# Patient Record
Sex: Female | Born: 1955 | Race: Black or African American | Hispanic: No | Marital: Single | Smoking: Never smoker
Health system: Southern US, Community
[De-identification: ages and names within clinical notes are randomized; demographics above are authoritative.]

## PROBLEM LIST (undated history)

## (undated) DIAGNOSIS — M1990 Unspecified osteoarthritis, unspecified site: Secondary | ICD-10-CM

## (undated) DIAGNOSIS — M5010 Cervical disc disorder with radiculopathy, unspecified cervical region: Secondary | ICD-10-CM

## (undated) DIAGNOSIS — J45909 Unspecified asthma, uncomplicated: Secondary | ICD-10-CM

## (undated) DIAGNOSIS — K219 Gastro-esophageal reflux disease without esophagitis: Secondary | ICD-10-CM

## (undated) DIAGNOSIS — T4145XA Adverse effect of unspecified anesthetic, initial encounter: Secondary | ICD-10-CM

## (undated) DIAGNOSIS — Z8719 Personal history of other diseases of the digestive system: Secondary | ICD-10-CM

## (undated) DIAGNOSIS — M549 Dorsalgia, unspecified: Secondary | ICD-10-CM

## (undated) DIAGNOSIS — T8859XA Other complications of anesthesia, initial encounter: Secondary | ICD-10-CM

## (undated) DIAGNOSIS — G4700 Insomnia, unspecified: Secondary | ICD-10-CM

## (undated) DIAGNOSIS — J189 Pneumonia, unspecified organism: Secondary | ICD-10-CM

## (undated) HISTORY — PX: COLONOSCOPY: SHX174

## (undated) HISTORY — PX: APPENDECTOMY: SHX54

## (undated) HISTORY — PX: CERVICAL SPINE SURGERY: SHX589

## (undated) HISTORY — PX: ABDOMINAL HYSTERECTOMY: SHX81

## (undated) HISTORY — PX: DILATION AND CURETTAGE OF UTERUS: SHX78

## (undated) HISTORY — DX: Cervical disc disorder with radiculopathy, unspecified cervical region: M50.10

## (undated) HISTORY — DX: Gastro-esophageal reflux disease without esophagitis: K21.9

## (undated) HISTORY — PX: BACK SURGERY: SHX140

## (undated) HISTORY — PX: TONSILLECTOMY: SUR1361

---

## 1998-07-25 ENCOUNTER — Ambulatory Visit (HOSPITAL_COMMUNITY): Admission: RE | Admit: 1998-07-25 | Discharge: 1998-07-25 | Payer: Self-pay | Admitting: Gynecology

## 1998-12-23 ENCOUNTER — Other Ambulatory Visit: Admission: RE | Admit: 1998-12-23 | Discharge: 1998-12-23 | Payer: Self-pay | Admitting: Gynecology

## 1999-03-01 ENCOUNTER — Emergency Department (HOSPITAL_COMMUNITY): Admission: EM | Admit: 1999-03-01 | Discharge: 1999-03-01 | Payer: Self-pay | Admitting: Emergency Medicine

## 1999-03-31 ENCOUNTER — Ambulatory Visit (HOSPITAL_COMMUNITY): Admission: RE | Admit: 1999-03-31 | Discharge: 1999-03-31 | Payer: Self-pay | Admitting: Gastroenterology

## 1999-03-31 ENCOUNTER — Encounter: Payer: Self-pay | Admitting: Gastroenterology

## 1999-04-24 ENCOUNTER — Ambulatory Visit (HOSPITAL_COMMUNITY): Admission: RE | Admit: 1999-04-24 | Discharge: 1999-04-24 | Payer: Self-pay | Admitting: Gastroenterology

## 2000-03-24 ENCOUNTER — Encounter: Payer: Self-pay | Admitting: Family Medicine

## 2000-03-24 ENCOUNTER — Encounter: Admission: RE | Admit: 2000-03-24 | Discharge: 2000-03-24 | Payer: Self-pay

## 2000-04-06 ENCOUNTER — Encounter: Admission: RE | Admit: 2000-04-06 | Discharge: 2000-04-06 | Payer: Self-pay

## 2001-06-02 ENCOUNTER — Encounter: Admission: RE | Admit: 2001-06-02 | Discharge: 2001-06-02 | Payer: Self-pay | Admitting: Obstetrics and Gynecology

## 2001-06-02 ENCOUNTER — Encounter: Payer: Self-pay | Admitting: Obstetrics and Gynecology

## 2001-12-21 ENCOUNTER — Encounter: Payer: Self-pay | Admitting: Chiropractor

## 2001-12-21 ENCOUNTER — Ambulatory Visit (HOSPITAL_COMMUNITY): Admission: RE | Admit: 2001-12-21 | Discharge: 2001-12-21 | Payer: Self-pay | Admitting: Chiropractor

## 2002-02-13 ENCOUNTER — Other Ambulatory Visit: Admission: RE | Admit: 2002-02-13 | Discharge: 2002-02-13 | Payer: Self-pay | Admitting: Obstetrics and Gynecology

## 2002-04-10 ENCOUNTER — Encounter: Payer: Self-pay | Admitting: Specialist

## 2002-04-17 ENCOUNTER — Inpatient Hospital Stay (HOSPITAL_COMMUNITY): Admission: RE | Admit: 2002-04-17 | Discharge: 2002-04-19 | Payer: Self-pay | Admitting: Specialist

## 2002-04-17 ENCOUNTER — Encounter: Payer: Self-pay | Admitting: Specialist

## 2002-05-08 ENCOUNTER — Observation Stay (HOSPITAL_COMMUNITY): Admission: EM | Admit: 2002-05-08 | Discharge: 2002-05-09 | Payer: Self-pay | Admitting: Emergency Medicine

## 2002-05-08 ENCOUNTER — Encounter: Payer: Self-pay | Admitting: *Deleted

## 2002-06-20 ENCOUNTER — Encounter: Payer: Self-pay | Admitting: Obstetrics and Gynecology

## 2002-06-20 ENCOUNTER — Encounter: Admission: RE | Admit: 2002-06-20 | Discharge: 2002-06-20 | Payer: Self-pay | Admitting: Obstetrics and Gynecology

## 2014-12-09 ENCOUNTER — Emergency Department (HOSPITAL_COMMUNITY)
Admission: EM | Admit: 2014-12-09 | Discharge: 2014-12-09 | Disposition: A | Discharge status: Home / Self Care | Payer: No Typology Code available for payment source | Attending: Emergency Medicine | Admitting: Emergency Medicine

## 2014-12-09 ENCOUNTER — Emergency Department (HOSPITAL_COMMUNITY): Payer: No Typology Code available for payment source

## 2014-12-09 ENCOUNTER — Encounter (HOSPITAL_COMMUNITY): Payer: Self-pay | Admitting: Emergency Medicine

## 2014-12-09 DIAGNOSIS — M5412 Radiculopathy, cervical region: Secondary | ICD-10-CM | POA: Diagnosis not present

## 2014-12-09 DIAGNOSIS — M25511 Pain in right shoulder: Secondary | ICD-10-CM | POA: Diagnosis present

## 2014-12-09 DIAGNOSIS — Z981 Arthrodesis status: Secondary | ICD-10-CM | POA: Diagnosis not present

## 2014-12-09 DIAGNOSIS — M542 Cervicalgia: Secondary | ICD-10-CM

## 2014-12-09 HISTORY — DX: Dorsalgia, unspecified: M54.9

## 2014-12-09 MED ORDER — DIAZEPAM 5 MG PO TABS: 5.0000 mg | ORAL_TABLET | Freq: Two times a day (BID) | ORAL | Status: DC

## 2014-12-09 MED ORDER — MORPHINE SULFATE 4 MG/ML IJ SOLN
4.0000 mg | Freq: Once | INTRAMUSCULAR | Status: AC
  Administered 2014-12-09: 4 mg via INTRAMUSCULAR
  Filled 2014-12-09: qty 1

## 2014-12-09 MED ORDER — IBUPROFEN 200 MG PO TABS
400.0000 mg | ORAL_TABLET | Freq: Once | ORAL | Status: AC
  Administered 2014-12-09: 400 mg via ORAL
  Filled 2014-12-09: qty 2

## 2014-12-09 MED ORDER — OXYCODONE-ACETAMINOPHEN 5-325 MG PO TABS: ORAL_TABLET | ORAL | Status: DC

## 2014-12-09 MED ORDER — DIAZEPAM 5 MG PO TABS
5.0000 mg | ORAL_TABLET | Freq: Once | ORAL | Status: AC
  Administered 2014-12-09: 5 mg via ORAL
  Filled 2014-12-09: qty 1

## 2014-12-09 NOTE — ED Notes (Signed)
Pt reports increased pain in r/shoulder over last week, radiating to back. Tx with dry heating pad. Denies trauma , denies change in sleeping arrangements

## 2014-12-09 NOTE — ED Provider Notes (Signed)
CSN: [id number]     Arrival date & time 12/09/14  1110 History   First MD Initiated Contact with Patient 12/09/14 1203     Chief Complaint  Patient presents with  . Shoulder Pain    one week hx of increased shoulder pain, denies trauma     (Consider location/radiation/quality/duration/timing/severity/associated sxs/prior Treatment) HPI  Tabitha Mcdonald is a 58 y.o. female complaining of severe pain to neck and right shoulder worsening over the course of 3 days. Patient has been applying heat with little relief. Patient rates her pain at 9 out of 10, is exacerbated by movement and palpation. Described as aching and sharp. Patient denies trauma, she has a history of remote C5-C6 fusion vitamin cut in 2002. She denies weakness, numbness. Patient is visiting family members in town, she lives in [place].  Past Medical History  Diagnosis Date  . Back pain    Past Surgical History  Procedure Laterality Date  . Back surgery    . Abdominal hysterectomy    . Tonsillectomy     Family History  Problem Relation Age of Onset  . Hypertension Father   . Diabetes Other    History  Substance Use Topics  . Smoking status: Never Smoker   . Smokeless tobacco: Not on file  . Alcohol Use: No   OB History    No data available     Review of Systems  10 systems reviewed and found to be negative, except as noted in the HPI.   Allergies  Review of patient's allergies indicates no known allergies.  Home Medications   Prior to Admission medications   Medication Sig Start Date End Date Taking? Authorizing Provider  albuterol (PROVENTIL HFA;VENTOLIN HFA) 108 (90 BASE) MCG/ACT inhaler Inhale 1 puff into the lungs every 6 (six) hours as needed for wheezing or shortness of breath.   Yes Historical Provider, MD  estradiol (VIVELLE-DOT) 0.05 MG/24HR patch Place 1 patch onto the skin 2 (two) times a week. Changes on Monday and Thursday   Yes Historical Provider, MD  zolpidem (AMBIEN) 10 MG tablet  Take 10 mg by mouth at bedtime as needed for sleep.   Yes Historical Provider, MD   BP 138/88 mmHg  Pulse 86  Temp(Src) 98.2 F (36.8 C) (Oral)  Resp 20  Wt 170 lb (77.111 kg)  SpO2 100% Physical Exam  Constitutional: She is oriented to person, place, and time. She appears well-developed and well-nourished. No distress.  HENT:  Head: Normocephalic and atraumatic.  Mouth/Throat: Oropharynx is clear and moist.  Eyes: Conjunctivae and EOM are normal. Pupils are equal, round, and reactive to light.  Neck: Normal range of motion.    Right trapezius spasm with tenderness to palpation. There is no midline C-spine tenderness to palpation, patient is splinting, does not move in right lateral flexion. Spurling test is positive on the right side.   Patient has full strength to right bicep, triceps and grip strength. Sensation is intact, able to differentiate between pinprick and light touch.  Cardiovascular: Normal rate, regular rhythm and intact distal pulses.   Pulmonary/Chest: Effort normal and breath sounds normal. No stridor. No respiratory distress. She has no wheezes. She has no rales. She exhibits no tenderness.  Abdominal: Soft. Bowel sounds are normal. She exhibits no distension and no mass. There is no tenderness. There is no rebound and no guarding.  Musculoskeletal: Normal range of motion.  Neurological: She is alert and oriented to person, place, and time.  Psychiatric: She has  a normal mood and affect.  Nursing note and vitals reviewed.   ED Course  Procedures (including critical care time) Labs Review Labs Reviewed - No data to display  Imaging Review No results found.   EKG Interpretation None      MDM   Final diagnoses:  Cervicalgia  Cervical radicular pain    Filed Vitals:   12/09/14 1155 12/09/14 1320 12/09/14 1322  BP: 138/88  136/78  Pulse: 86 77   Temp: 98.2 F (36.8 C)    TempSrc: Oral    Resp: 20 18   Weight: 170 lb (77.111 kg)    SpO2: 100%  99%     Medications  morphine 4 MG/ML injection 4 mg (4 mg Intramuscular Given 12/09/14 1219)  ibuprofen (ADVIL,MOTRIN) tablet 400 mg (400 mg Oral Given 12/09/14 1219)  diazepam (VALIUM) tablet 5 mg (5 mg Oral Given 12/09/14 1219)    Tabitha Mcdonald is a pleasant 58 y.o. female presenting with severe cervicalgia, patient has history of C5-C6 fusion by Dr. Otelia SergeantNitka in 2002. There was no trauma, patient is afebrile, she has no decrease in her strength or sensation. X-ray with no acute abnormality, they do note a bone spur. Thinks likely secondary to a radiculopathy. After patient was given IM morphine there is significant improvement in pain and also range of motion. Patient will be given Percocet and Valium. She lives in [place]. I've asked her to follow with her primary care doctor so she can be referred to a neurosurgeon in [place] for specialist evaluation.  Evaluation does not show pathology that would require ongoing emergent intervention or inpatient treatment. Pt is hemodynamically stable and mentating appropriately. Discussed findings and plan with patient/guardian, who agrees with care plan. All questions answered. Return precautions discussed and outpatient follow up given.   Discharge Medication List as of 12/09/2014  1:11 PM    START taking these medications   Details  diazepam (VALIUM) 5 MG tablet Take 1 tablet (5 mg total) by mouth 2 (two) times daily., Starting 12/09/2014, Until Discontinued, Print    oxyCODONE-acetaminophen (PERCOCET/ROXICET) 5-325 MG per tablet 1 to 2 tabs PO q6hrs  PRN for pain, Print             Wynetta Emeryicole Geniya Fulgham, PA-C 12/09/14 1347  Suzi RootsKevin E Steinl, MD 12/10/14 [phone]

## 2014-12-09 NOTE — Discharge Instructions (Signed)
Please take ibuprofen 400mg (this is normally 2 over the counter pills) every 6 hours (take with food to minimze stomach irritation).  ° °Take valium and/or percocet for breakthrough pain, do not drink alcohol, drive, care for children or perfom other critical tasks while taking valium and/or percocet. ° °Please follow with your primary care doctor in the next 2 days for a check-up. They must obtain records for further management.  ° °Do not hesitate to return to the Emergency Department for any new, worsening or concerning symptoms.  ° ° °

## 2014-12-09 NOTE — ED Notes (Signed)
Pt given 2 prescips and walked to d/cwindow.

## 2014-12-17 ENCOUNTER — Other Ambulatory Visit: Payer: Self-pay | Admitting: Specialist

## 2014-12-17 DIAGNOSIS — M542 Cervicalgia: Secondary | ICD-10-CM

## 2014-12-18 ENCOUNTER — Other Ambulatory Visit (HOSPITAL_BASED_OUTPATIENT_CLINIC_OR_DEPARTMENT_OTHER): Payer: Self-pay | Admitting: Specialist

## 2014-12-18 DIAGNOSIS — M542 Cervicalgia: Secondary | ICD-10-CM

## 2014-12-21 ENCOUNTER — Ambulatory Visit (HOSPITAL_BASED_OUTPATIENT_CLINIC_OR_DEPARTMENT_OTHER): Payer: No Typology Code available for payment source

## 2014-12-28 ENCOUNTER — Ambulatory Visit (HOSPITAL_BASED_OUTPATIENT_CLINIC_OR_DEPARTMENT_OTHER)
Admission: RE | Admit: 2014-12-28 | Discharge: 2014-12-28 | Disposition: A | Discharge status: Home / Self Care | Payer: No Typology Code available for payment source | Source: Ambulatory Visit | Attending: Specialist | Admitting: Specialist

## 2014-12-28 ENCOUNTER — Other Ambulatory Visit: Payer: No Typology Code available for payment source

## 2014-12-28 DIAGNOSIS — Z981 Arthrodesis status: Secondary | ICD-10-CM | POA: Insufficient documentation

## 2014-12-28 DIAGNOSIS — M5021 Other cervical disc displacement,  high cervical region: Secondary | ICD-10-CM | POA: Insufficient documentation

## 2014-12-28 DIAGNOSIS — M542 Cervicalgia: Secondary | ICD-10-CM

## 2015-01-21 ENCOUNTER — Ambulatory Visit (INDEPENDENT_AMBULATORY_CARE_PROVIDER_SITE_OTHER): Payer: Self-pay | Admitting: Neurology

## 2015-01-21 ENCOUNTER — Ambulatory Visit (INDEPENDENT_AMBULATORY_CARE_PROVIDER_SITE_OTHER): Payer: No Typology Code available for payment source | Admitting: Neurology

## 2015-01-21 ENCOUNTER — Encounter: Payer: Self-pay | Admitting: Neurology

## 2015-01-21 DIAGNOSIS — M5010 Cervical disc disorder with radiculopathy, unspecified cervical region: Secondary | ICD-10-CM

## 2015-01-21 HISTORY — DX: Cervical disc disorder with radiculopathy, unspecified cervical region: M50.10

## 2015-01-21 NOTE — Procedures (Signed)
     HISTORY:  Tabitha Mcdonald is a 59 year old patient with a prior history of cervical spine surgery at C5-6 level. This was done 10 years ago, but the patient has had recent onset of left arm discomfort from the shoulder to the hand. She is being evaluated for possible cervical radiculopathy.  NERVE CONDUCTION STUDIES:  Nerve conduction studies were performed on both upper extremities. The distal motor latencies and motor amplitudes for the median and ulnar nerves were within normal limits. The F wave latencies and nerve conduction velocities for these nerves were also normal. The sensory latencies for the median and ulnar nerves were normal.   EMG STUDIES:  EMG study was performed on the left upper extremity:  The first dorsal interosseous muscle reveals 2 to 4 K units with full recruitment. No fibrillations or positive waves were noted. The abductor pollicis brevis muscle reveals 2 to 4 K units with full recruitment. No fibrillations or positive waves were noted. The extensor indicis proprius muscle reveals 1 to 3 K units with full recruitment. No fibrillations or positive waves were noted. The pronator teres muscle reveals 2 to 3 K units with full recruitment. No fibrillations or positive waves were noted. The biceps muscle reveals 2 to 4 K units with decreased recruitment. No fibrillations or positive waves were noted. The triceps muscle reveals 2 to 4 K units with full recruitment. No fibrillations or positive waves were noted.  The patient refused further EMG testing.  IMPRESSION:  Nerve conduction studies done on both upper extremities were unremarkable, without evidence of a neuropathy seen. EMG evaluation of the left upper extremity was somewhat limited, but showed findings that could be consistent with a mild chronic stable C6 radiculopathy.  [name]. Keith Nekhi Liwanag MD 01/21/2015 4:35 PM  [facility] [address] [place], [place]  [id number]  Phone [phone] Fax [phone]

## 2015-01-21 NOTE — Progress Notes (Signed)
Please refer to EMG and nerve conduction study procedure note. 

## 2015-07-19 ENCOUNTER — Emergency Department (HOSPITAL_COMMUNITY)
Admission: EM | Admit: 2015-07-19 | Discharge: 2015-07-19 | Disposition: A | Discharge status: Home / Self Care | Payer: Worker's Compensation | Attending: Emergency Medicine | Admitting: Emergency Medicine

## 2015-07-19 ENCOUNTER — Emergency Department (HOSPITAL_COMMUNITY): Payer: Worker's Compensation

## 2015-07-19 DIAGNOSIS — Y9389 Activity, other specified: Secondary | ICD-10-CM | POA: Insufficient documentation

## 2015-07-19 DIAGNOSIS — S8991XA Unspecified injury of right lower leg, initial encounter: Secondary | ICD-10-CM | POA: Insufficient documentation

## 2015-07-19 DIAGNOSIS — Z8739 Personal history of other diseases of the musculoskeletal system and connective tissue: Secondary | ICD-10-CM | POA: Insufficient documentation

## 2015-07-19 DIAGNOSIS — Z79899 Other long term (current) drug therapy: Secondary | ICD-10-CM | POA: Diagnosis not present

## 2015-07-19 DIAGNOSIS — W1839XA Other fall on same level, initial encounter: Secondary | ICD-10-CM | POA: Diagnosis not present

## 2015-07-19 DIAGNOSIS — M62838 Other muscle spasm: Secondary | ICD-10-CM | POA: Insufficient documentation

## 2015-07-19 DIAGNOSIS — S4991XA Unspecified injury of right shoulder and upper arm, initial encounter: Secondary | ICD-10-CM | POA: Insufficient documentation

## 2015-07-19 DIAGNOSIS — S199XXA Unspecified injury of neck, initial encounter: Secondary | ICD-10-CM | POA: Diagnosis present

## 2015-07-19 DIAGNOSIS — S79911A Unspecified injury of right hip, initial encounter: Secondary | ICD-10-CM | POA: Diagnosis not present

## 2015-07-19 DIAGNOSIS — Y990 Civilian activity done for income or pay: Secondary | ICD-10-CM | POA: Diagnosis not present

## 2015-07-19 DIAGNOSIS — S161XXA Strain of muscle, fascia and tendon at neck level, initial encounter: Secondary | ICD-10-CM

## 2015-07-19 DIAGNOSIS — Y9289 Other specified places as the place of occurrence of the external cause: Secondary | ICD-10-CM | POA: Insufficient documentation

## 2015-07-19 MED ORDER — IBUPROFEN 800 MG PO TABS: 800.0000 mg | ORAL_TABLET | Freq: Three times a day (TID) | ORAL | Status: DC | PRN

## 2015-07-19 MED ORDER — HYDROCODONE-ACETAMINOPHEN 5-325 MG PO TABS: 1.0000 | ORAL_TABLET | Freq: Four times a day (QID) | ORAL | Status: DC | PRN

## 2015-07-19 MED ORDER — DIAZEPAM 5 MG/ML IJ SOLN
5.0000 mg | Freq: Once | INTRAMUSCULAR | Status: AC
  Administered 2015-07-19: 5 mg via INTRAVENOUS
  Filled 2015-07-19: qty 2

## 2015-07-19 MED ORDER — DIAZEPAM 5 MG PO TABS: 5.0000 mg | ORAL_TABLET | Freq: Three times a day (TID) | ORAL | Status: DC | PRN

## 2015-07-19 MED ORDER — FENTANYL CITRATE (PF) 100 MCG/2ML IJ SOLN
100.0000 ug | Freq: Once | INTRAMUSCULAR | Status: AC
  Administered 2015-07-19: 100 ug via INTRAVENOUS
  Filled 2015-07-19: qty 2

## 2015-07-19 NOTE — ED Provider Notes (Signed)
CSN: [id number]     Arrival date & time 07/19/15  1539 History   First MD Initiated Contact with Patient 07/19/15 1542     Chief Complaint  Patient presents with  . Fall     (Consider location/radiation/quality/duration/timing/severity/associated sxs/prior Treatment) HPI   Patient is a 59 year old female who presents s/p fall this morning at 10:55.  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  Patient states that she immediately felt pain in her knee, but was able to stand up and continue to assist the patron. Over the course of the day, the patient has felt increasing pain on her right.  She now feels pain in her right neck, right shoulder, her back, right hip, and right knee.  Patient states that she began having painful spasms and decided to go to Urgent care.  At Urgent care, she began having jerking motions with her spasms and they advised her to present to the ED. Patient states her pain is 7/10.  She denies nausea, vomiting, abdominal pain, headache, dizziness, loss of consciousness.   Past Medical History  Diagnosis Date  . Back pain   . Cervical disc disorder with radiculopathy 01/21/2015   Past Surgical History  Procedure Laterality Date  . Back surgery    . Abdominal hysterectomy    . Tonsillectomy     Family History  Problem Relation Age of Onset  . Hypertension Father   . Diabetes Other    History  Substance Use Topics  . Smoking status: Never Smoker   . Smokeless tobacco: Not on file  . Alcohol Use: No   OB History    No data available     Review of Systems All other systems negative except as documented in the HPI. All pertinent positives and negatives as reviewed in the HPI.    Allergies  Review of patient's allergies indicates no known allergies.  Home Medications   Prior to Admission medications   Medication Sig Start Date End Date Taking?  Authorizing Provider  zolpidem (AMBIEN) 10 MG tablet Take 10 mg by mouth at bedtime.    Yes Historical Provider, MD  albuterol (PROVENTIL HFA;VENTOLIN HFA) 108 (90 BASE) MCG/ACT inhaler Inhale 1 puff into the lungs every 6 (six) hours as needed for wheezing or shortness of breath.    Historical Provider, MD  diazepam (VALIUM) 5 MG tablet Take 1 tablet (5 mg total) by mouth 2 (two) times daily. Patient not taking: Reported on 07/19/2015 12/09/14   Joni Reining Pisciotta, PA-C  oxyCODONE-acetaminophen (PERCOCET/ROXICET) 5-325 MG per tablet 1 to 2 tabs PO q6hrs  PRN for pain Patient not taking: Reported on 07/19/2015 12/09/14   Joni Reining Pisciotta, PA-C   BP 158/94 mmHg  Pulse 70  Temp(Src) 98.2 F (36.8 C) (Oral)  Resp 16  SpO2 99% Physical Exam  Constitutional: She is oriented to person, place, and time. She appears well-developed and well-nourished. No distress.  HENT:  Head: Normocephalic and atraumatic.  Mouth/Throat: Oropharynx is clear and moist.  Eyes: Pupils are equal, round, and reactive to light.  Neck: Normal range of motion. Neck supple.  Cardiovascular: Normal rate, regular rhythm and normal heart sounds.  Exam reveals no friction rub.   No murmur heard. Pulmonary/Chest: Effort normal and breath sounds normal. No respiratory distress. She has no wheezes. She has no rales.  Abdominal: She exhibits no distension. There is  no tenderness.  Musculoskeletal:       Right shoulder: She exhibits pain.       Right hip: She exhibits tenderness.       Right knee: Tenderness found.       Cervical back: She exhibits tenderness. She exhibits normal range of motion, no bony tenderness and no swelling.  Neurological: She is alert and oriented to person, place, and time. She exhibits normal muscle tone. Coordination normal.  Skin: Skin is warm and dry. She is not diaphoretic.  Psychiatric: She has a normal mood and affect. Her behavior is normal.  Nursing note and vitals reviewed.   ED Course   Procedures (including critical care time) Labs Review Labs Reviewed - No data to display  Imaging Review No results found.   Patient is referred to the workman's comp, follow-up that her employer utilizes told to return here as needed.  Told to increase her fluid intake, rest as much as possible.  Use ice and heat on the areas that are sore  Charlestine Night, PA-C 07/22/15 9147  Paula Libra, MD 07/22/15 [phone]

## 2015-07-19 NOTE — ED Notes (Signed)
Bed: WA04 Expected date:  Expected time:  Means of arrival:  Comments: Fall 

## 2015-07-19 NOTE — Discharge Instructions (Signed)
Return here as needed. You will need to follow up with whoever your employer uses for Workmen's Comp. follow-up.

## 2015-07-19 NOTE — ED Notes (Signed)
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 Rates pain 7/10 describes it as spasm.

## 2015-09-14 ENCOUNTER — Emergency Department (HOSPITAL_COMMUNITY)
Admission: EM | Admit: 2015-09-14 | Discharge: 2015-09-14 | Disposition: A | Discharge status: Home / Self Care | Payer: No Typology Code available for payment source | Attending: Emergency Medicine | Admitting: Emergency Medicine

## 2015-09-14 ENCOUNTER — Encounter (HOSPITAL_COMMUNITY): Payer: Self-pay

## 2015-09-14 DIAGNOSIS — Z79899 Other long term (current) drug therapy: Secondary | ICD-10-CM | POA: Insufficient documentation

## 2015-09-14 DIAGNOSIS — M545 Low back pain: Secondary | ICD-10-CM | POA: Insufficient documentation

## 2015-09-14 DIAGNOSIS — M549 Dorsalgia, unspecified: Secondary | ICD-10-CM

## 2015-09-14 MED ORDER — HYDROMORPHONE HCL 1 MG/ML IJ SOLN
1.0000 mg | Freq: Once | INTRAMUSCULAR | Status: AC
  Administered 2015-09-14: 1 mg via INTRAMUSCULAR
  Filled 2015-09-14: qty 1

## 2015-09-14 MED ORDER — OXYCODONE-ACETAMINOPHEN 5-325 MG PO TABS: 1.0000 | ORAL_TABLET | ORAL | Status: DC | PRN

## 2015-09-14 MED ORDER — METHOCARBAMOL 500 MG PO TABS: 500.0000 mg | ORAL_TABLET | Freq: Two times a day (BID) | ORAL | Status: DC

## 2015-09-14 MED ORDER — ONDANSETRON 4 MG PO TBDP
4.0000 mg | ORAL_TABLET | Freq: Once | ORAL | Status: AC
  Administered 2015-09-14: 4 mg via ORAL
  Filled 2015-09-14: qty 1

## 2015-09-14 NOTE — ED Notes (Signed)
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

## 2015-09-14 NOTE — Discharge Instructions (Signed)
Take the prescribed medication as directed.  Do not drive while taking oxycodone, it can make you drowsy. Follow-up with your primary care physician. Return to the ED for new or worsening symptoms.

## 2015-09-14 NOTE — ED Notes (Signed)
Pt reports 10/10 "electrifying" right lower back pain radiating down right leg. She is tearful and uncomfortable but in no emergent distress.

## 2015-09-14 NOTE — ED Provider Notes (Signed)
CSN: [id number]     Arrival date & time 09/14/15  1846 History  By signing my name below, I, Octavia Heir, attest that this documentation has been prepared under the direction and in the presence of Sharilyn Sites, PA-C. Electronically Signed: Octavia Heir, ED Scribe. 09/14/2015. 8:34 PM.    Chief Complaint  Patient presents with  . Back Pain      The history is provided by the patient. No language interpreter was used.   HPI Comments: Tabitha Mcdonald is a 59 y.o. 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 She rates her pain a current 8/10 and describes the pain as "electrifying" with spasms going down her right leg. Pt believes she over extended her leg when she pressed the emergency brake while driving. Pt notes difficulty while walking due to pain.  She denies lower back surgery.  No numbness, paresthesias or weakness of extremities.  No loss of bowel or bladder control.  No intervention tried PTA.  VSS.  Past Medical History  Diagnosis Date  . Back pain   . Cervical disc disorder with radiculopathy 01/21/2015   Past Surgical History  Procedure Laterality Date  . Back surgery    . Abdominal hysterectomy    . Tonsillectomy     Family History  Problem Relation Age of Onset  . Hypertension Father   . Diabetes Other    Social History  Substance Use Topics  . Smoking status: Never Smoker   . Smokeless tobacco: Never Used  . Alcohol Use: No   OB History    No data available     Review of Systems  Musculoskeletal: Positive for back pain.  All other systems reviewed and are negative.     Allergies  Amoxicillin  Home Medications   Prior to Admission medications   Medication Sig Start Date End Date Taking? Authorizing Provider  albuterol (PROVENTIL HFA;VENTOLIN HFA) 108 (90 BASE) MCG/ACT inhaler Inhale 1  puff into the lungs every 6 (six) hours as needed for wheezing or shortness of breath.    Historical Provider, MD  diazepam (VALIUM) 5 MG tablet Take 1 tablet (5 mg total) by mouth every 8 (eight) hours as needed for muscle spasms. 07/19/15   Charlestine Night, PA-C  HYDROcodone-acetaminophen (NORCO/VICODIN) 5-325 MG per tablet Take 1 tablet by mouth every 6 (six) hours as needed for moderate pain. 07/19/15   Charlestine Night, PA-C  ibuprofen (ADVIL,MOTRIN) 800 MG tablet Take 1 tablet (800 mg total) by mouth every 8 (eight) hours as needed. 07/19/15   Charlestine Night, PA-C  oxyCODONE-acetaminophen (PERCOCET/ROXICET) 5-325 MG per tablet 1 to 2 tabs PO q6hrs  PRN for pain Patient not taking: Reported on 07/19/2015 12/09/14   Joni Reining Pisciotta, PA-C  zolpidem (AMBIEN) 10 MG tablet Take 10 mg by mouth at bedtime.     Historical Provider, MD   Triage vitals: BP 136/88 mmHg  Pulse 85  Temp(Src) 98.7 F (37.1 C) (Oral)  Resp 18  Ht  (1.575 m)  Wt 180 lb (81.647 kg)  BMI 32.91 kg/m2  SpO2 100% Physical Exam  Constitutional: She is oriented to person, place, and time. She appears well-developed and well-nourished.  HENT:  Head: Normocephalic and atraumatic.  Mouth/Throat: Oropharynx is clear and moist.  Eyes: Conjunctivae and EOM are normal. Pupils are equal, round, and reactive to light.  Neck: Normal range  of motion.  Cardiovascular: Normal rate, regular rhythm and normal heart sounds.   Pulmonary/Chest: Effort normal and breath sounds normal.  Abdominal: Soft. Bowel sounds are normal.  Musculoskeletal: Normal range of motion.  Tenderness of right SI joint without acute deformity; endorses pain radiating down right buttock and right posterior thigh but does not descend past the knee; normal strength and sensation of bilateral lower extremities; limping gait favoring right leg  Neurological: She is alert and oriented to person, place, and time.  Skin: Skin is warm and dry.  Psychiatric: She  has a normal mood and affect.  Nursing note and vitals reviewed.   ED Course  Procedures  DIAGNOSTIC STUDIES: Oxygen Saturation is 100% on RA, normal by my interpretation.  COORDINATION OF CARE:  8:32 PM Discussed treatment plan with pt at bedside and pt agreed to plan.  Labs Review Labs Reviewed - No data to display  Imaging Review No results found.    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 Symptoms and physical exam findings suggests sciatica. No focal neurologic deficits to suggest cauda equina. Patient was treated with Zofran and Dilaudid prior to my evaluation, states some improvement of her pain. Continues to have muscle spasm. Will discharge home with short supply pain medication as well as Robaxin. Patient to follow-up with her PCP.  Discussed plan with patient, he/she acknowledged understanding and agreed with plan of care.  Return precautions given for new or worsening symptoms.  I personally performed the services described in this documentation, which was scribed in my presence. The recorded information has been reviewed and is accurate.  Garlon Hatchet, PA-C 09/14/15 2104  Raeford Razor, MD 09/17/15 [phone]

## 2015-09-22 ENCOUNTER — Ambulatory Visit: Payer: No Typology Code available for payment source | Admitting: Family Medicine

## 2015-10-16 ENCOUNTER — Encounter: Payer: Self-pay | Admitting: Obstetrics & Gynecology

## 2015-10-16 ENCOUNTER — Ambulatory Visit (INDEPENDENT_AMBULATORY_CARE_PROVIDER_SITE_OTHER): Payer: BLUE CROSS/BLUE SHIELD | Admitting: Obstetrics & Gynecology

## 2015-10-16 VITALS — BP 138/87 | HR 81 | Resp 16 | Ht 64.0 in | Wt 183.0 lb

## 2015-10-16 DIAGNOSIS — B009 Herpesviral infection, unspecified: Secondary | ICD-10-CM | POA: Diagnosis not present

## 2015-10-16 DIAGNOSIS — Z01419 Encounter for gynecological examination (general) (routine) without abnormal findings: Secondary | ICD-10-CM | POA: Diagnosis not present

## 2015-10-16 DIAGNOSIS — Z23 Encounter for immunization: Secondary | ICD-10-CM

## 2015-10-16 DIAGNOSIS — N951 Menopausal and female climacteric states: Secondary | ICD-10-CM

## 2015-10-16 DIAGNOSIS — Z0000 Encounter for general adult medical examination without abnormal findings: Secondary | ICD-10-CM

## 2015-10-16 MED ORDER — VALACYCLOVIR HCL 1 G PO TABS: 1000.0000 mg | ORAL_TABLET | Freq: Every day | ORAL | Status: DC

## 2015-10-16 MED ORDER — ESTRADIOL 0.1 MG/24HR TD PTTW: 1.0000 | MEDICATED_PATCH | TRANSDERMAL | Status: DC

## 2015-10-16 NOTE — Progress Notes (Signed)
Subjective:    Tabitha Mcdonald is a 59 y.o. D AA P2 (739 son and deceased daughter) female who presents for an annual exam. The patient has no complaints today except that she has run out of her estrogen patches 3 months ago and needs a refill. The patient is not currently sexually active. GYN screening history: last pap: was normal. The patient wears seatbelts: yes. The patient participates in regular exercise: yes. Has the patient ever been transfused or tattooed?: yes. The patient reports that there is not domestic violence in her life.   Menstrual History: OB History    Gravida Para Term Preterm AB TAB SAB Ectopic Multiple Living   2 2        1       Menarche age: 89  No LMP recorded. Patient has had a hysterectomy.    The following portions of the patient's history were reviewed and updated as appropriate: allergies, current medications, past family history, past medical history, past social history, past surgical history and problem list.  Review of Systems Pertinent items are noted in HPI.    Objective:    BP 138/87 mmHg  Pulse 81  Resp 16  Ht 5\' 4"  (1.626 m)  Wt 183 lb (83.008 kg)  BMI 31.40 kg/m2  General Appearance:    Alert, cooperative, no distress, appears stated age  Head:    Normocephalic, without obvious abnormality, atraumatic  Eyes:    PERRL, conjunctiva/corneas clear, EOM's intact, fundi    benign, both eyes  Ears:    Normal TM's and external ear canals, both ears  Nose:   Nares normal, septum midline, mucosa normal, no drainage    or sinus tenderness  Throat:   Lips, mucosa, and tongue normal; teeth and gums normal  Neck:   Supple, symmetrical, trachea midline, no adenopathy;    thyroid:  no enlargement/tenderness/nodules; no carotid   bruit or JVD  Back:     Symmetric, no curvature, ROM normal, no CVA tenderness  Lungs:     Clear to auscultation bilaterally, respirations unlabored  Chest Wall:    No tenderness or deformity   Heart:    Regular rate and  rhythm, S1 and S2 normal, no murmur, rub   or gallop  Breast Exam:    No tenderness, masses, or nipple abnormality  Abdomen:     Soft, non-tender, bowel sounds active all four quadrants,    no masses, no organomegaly  Genitalia:    Normal female without lesion, discharge or tenderness, atrophy, normal cuff, no masses on bimanual exam     Extremities:   Extremities normal, atraumatic, no cyanosis or edema  Pulses:   2+ and symmetric all extremities  Skin:   Skin color, texture, turgor normal, no rashes or lesions  Lymph nodes:   Cervical, supraclavicular, and axillary nodes normal  Neurologic:   CNII-XII intact, normal strength, sensation and reflexes    throughout  .    Assessment:    Healthy female exam.    Plan:     Breast self exam technique reviewed and patient encouraged to perform self-exam monthly. Mammogram. refill vivelle dot    Refill valtrex Flu vaccine Fasting labs tomorrow

## 2015-10-17 ENCOUNTER — Other Ambulatory Visit (INDEPENDENT_AMBULATORY_CARE_PROVIDER_SITE_OTHER): Payer: BLUE CROSS/BLUE SHIELD | Admitting: *Deleted

## 2015-10-17 DIAGNOSIS — Z01419 Encounter for gynecological examination (general) (routine) without abnormal findings: Secondary | ICD-10-CM

## 2015-10-17 LAB — COMPREHENSIVE METABOLIC PANEL
ALBUMIN: 3.9 g/dL (ref 3.6–5.1)
ALT: 26 U/L (ref 6–29)
AST: 28 U/L (ref 10–35)
Alkaline Phosphatase: 36 U/L (ref 33–130)
BILIRUBIN TOTAL: 0.3 mg/dL (ref 0.2–1.2)
BUN: 10 mg/dL (ref 7–25)
CO2: 30 mmol/L (ref 20–31)
Calcium: 9 mg/dL (ref 8.6–10.4)
Chloride: 106 mmol/L (ref 98–110)
Creat: 0.8 mg/dL (ref 0.50–1.05)
Glucose, Bld: 97 mg/dL (ref 65–99)
Potassium: 4.3 mmol/L (ref 3.5–5.3)
Sodium: 141 mmol/L (ref 135–146)
Total Protein: 6.6 g/dL (ref 6.1–8.1)

## 2015-10-17 LAB — CBC
HCT: 36.4 % (ref 36.0–46.0)
Hemoglobin: 12.5 g/dL (ref 12.0–15.0)
MCH: 29.7 pg (ref 26.0–34.0)
MCHC: 34.3 g/dL (ref 30.0–36.0)
MCV: 86.5 fL (ref 78.0–100.0)
MPV: 9.3 fL (ref 8.6–12.4)
Platelets: 203 10*3/uL (ref 150–400)
RBC: 4.21 MIL/uL (ref 3.87–5.11)
RDW: 14.1 % (ref 11.5–15.5)
WBC: 4.2 10*3/uL (ref 4.0–10.5)

## 2015-10-17 LAB — TSH: TSH: 1.793 u[IU]/mL (ref 0.350–4.500)

## 2015-10-17 LAB — LIPID PANEL
Cholesterol: 150 mg/dL (ref 125–200)
HDL: 55 mg/dL (ref >=46)
LDL CALC: 79 mg/dL (ref <130)
Total CHOL/HDL Ratio: 2.7 Ratio (ref <=5.0)
Triglycerides: 78 mg/dL (ref <150)
VLDL: 16 mg/dL (ref <30)

## 2015-11-13 ENCOUNTER — Ambulatory Visit (INDEPENDENT_AMBULATORY_CARE_PROVIDER_SITE_OTHER): Payer: BLUE CROSS/BLUE SHIELD

## 2015-11-13 DIAGNOSIS — Z1231 Encounter for screening mammogram for malignant neoplasm of breast: Secondary | ICD-10-CM

## 2015-11-13 DIAGNOSIS — Z0000 Encounter for general adult medical examination without abnormal findings: Secondary | ICD-10-CM

## 2015-12-15 ENCOUNTER — Encounter: Payer: Self-pay | Admitting: Obstetrics & Gynecology

## 2016-04-22 ENCOUNTER — Ambulatory Visit (INDEPENDENT_AMBULATORY_CARE_PROVIDER_SITE_OTHER): Payer: BLUE CROSS/BLUE SHIELD | Admitting: Family Medicine

## 2016-04-22 ENCOUNTER — Encounter: Payer: Self-pay | Admitting: Family Medicine

## 2016-04-22 VITALS — BP 134/87 | HR 73 | Wt 191.0 lb

## 2016-04-22 DIAGNOSIS — Z113 Encounter for screening for infections with a predominantly sexual mode of transmission: Secondary | ICD-10-CM | POA: Diagnosis not present

## 2016-04-22 DIAGNOSIS — G478 Other sleep disorders: Secondary | ICD-10-CM

## 2016-04-22 DIAGNOSIS — R5383 Other fatigue: Secondary | ICD-10-CM

## 2016-04-22 LAB — CBC
HCT: 38.7 % (ref 35.0–45.0)
Hemoglobin: 13 g/dL (ref 11.7–15.5)
MCH: 29.2 pg (ref 27.0–33.0)
MCHC: 33.6 g/dL (ref 32.0–36.0)
MCV: 87 fL (ref 80.0–100.0)
MPV: 9.9 fL (ref 7.5–12.5)
PLATELETS: 212 10*3/uL (ref 140–400)
RBC: 4.45 MIL/uL (ref 3.80–5.10)
RDW: 14.5 % (ref 11.0–15.0)
WBC: 3.9 10*3/uL (ref 3.8–10.8)

## 2016-04-22 NOTE — Progress Notes (Signed)
CC: Tabitha Mcdonald is a 60 y.o. female is here for Establish Care and Fatigue   Subjective: HPI:   very pleasant 60 year old here to establish care.  She tells me 2 weeks ago she woke up feeling like she didn't actually fall sleep the night before, she describes further as fatigue and exhaustion. It has not been getting better since onset. She takes Ambien to fall asleep but wakes up and still feels tired. She denies snoring or known apnea. No changes in her life other than these new symptoms. She denies any other new symptoms other than that described above. She denies any diets. She denies shortness of breath or weakness. Symptoms are moderate to severe in severity and not getting any better or worse. She's never had this before.    Review of Systems - General ROS: negative for - chills, fever, night sweats, weight gain or weight loss Ophthalmic ROS: negative for - decreased vision Psychological ROS: negative for - anxiety or depression ENT ROS: negative for - hearing change, nasal congestion, tinnitus or allergies Hematological and Lymphatic ROS: negative for - bleeding problems, bruising or swollen lymph nodes Breast ROS: negative Respiratory ROS: no cough, shortness of breath, or wheezing Cardiovascular ROS: no chest pain or dyspnea on exertion Gastrointestinal ROS: no abdominal pain, change in bowel habits, or black or bloody stools Genito-Urinary ROS: negative for - genital discharge, genital ulcers, incontinence or abnormal bleeding from genitals Musculoskeletal ROS: negative for - joint pain or muscle pain Neurological ROS: negative for - headaches or memory loss Dermatological ROS: negative for lumps, mole changes, rash and skin lesion changes  Past Medical History  Diagnosis Date  . Back pain   . Cervical disc disorder with radiculopathy 01/21/2015  . GERD (gastroesophageal reflux disease)     Past Surgical History  Procedure Laterality Date  . Back surgery    . Abdominal  hysterectomy    . Tonsillectomy    . Cesarean section     Family History  Problem Relation Age of Onset  . Hypertension Father   . Diabetes Son     Social History   Social History  . Marital Status: Single    Spouse Name: N/A  . Number of Children: N/A  . Years of Education: N/A   Occupational History  . driver    Social History Main Topics  . Smoking status: Never Smoker   . Smokeless tobacco: Never Used  . Alcohol Use: No  . Drug Use: No  . Sexual Activity: No   Other Topics Concern  . Not on file   Social History Narrative     Objective: BP 134/87 mmHg  Pulse 73  Wt 191 lb (86.637 kg)  General: Alert and Oriented, No Acute Distress HEENT: Pupils equal, round, reactive to light. Conjunctivae clear.  Moist mucous membranes Lungs: Clear to auscultation bilaterally, no wheezing/ronchi/rales.  Comfortable work of breathing. Good air movement. Cardiac: Regular rate and rhythm. Normal S1/S2.  No murmurs, rubs, nor gallops.   Extremities: No peripheral edema.  Strong peripheral pulses.  Mental Status: No depression, anxiety, nor agitation. Skin: Warm and dry.  Assessment & Plan: [name] was seen today for establish care and fatigue.  Diagnoses and all orders for this visit:  Non-restorative sleep -     Home sleep test -     TSH -     CBC -     COMPLETE METABOLIC PANEL WITH GFR -     VITAMIN D 25 Hydroxy (Vit-D Deficiency,  Fractures) -     Vitamin B12  Other fatigue -     Home sleep test -     TSH -     CBC -     COMPLETE METABOLIC PANEL WITH GFR -     VITAMIN D 25 Hydroxy (Vit-D Deficiency, Fractures) -     Vitamin B12  Screening for STD (sexually transmitted disease) -     HIV antibody -     Hepatitis C antibody -     RPR -     GC/Chlamydia Probe Amp   Nonrestorative sleep: Rule out sleep apnea with home sleep test. Rule out hyperthyroidism, anemia, metabolic abnormality or vitamin D deficiency above She wants to know that before getting blood  work for the above investigation can she also be tested for common sexually transmitted diseases. She denies any symptoms that are prompting this she just wants to play it safe.  Return if symptoms worsen or fail to improve.

## 2016-04-23 LAB — VITAMIN B12: Vitamin B-12: 857 pg/mL (ref 200–1100)

## 2016-04-23 LAB — TSH: TSH: 2.04 m[IU]/L

## 2016-04-23 LAB — COMPLETE METABOLIC PANEL WITH GFR
ALT: 15 U/L (ref 6–29)
AST: 18 U/L (ref 10–35)
Albumin: 3.9 g/dL (ref 3.6–5.1)
Alkaline Phosphatase: 30 U/L — ABNORMAL LOW (ref 33–130)
BUN: 10 mg/dL (ref 7–25)
CHLORIDE: 107 mmol/L (ref 98–110)
CO2: 23 mmol/L (ref 20–31)
Calcium: 8.9 mg/dL (ref 8.6–10.4)
Creat: 0.75 mg/dL (ref 0.50–1.05)
GFR, EST NON AFRICAN AMERICAN: 89 mL/min (ref >=60)
GFR, Est African American: >89 mL/min (ref >=60)
GLUCOSE: 83 mg/dL (ref 65–99)
Potassium: 4.2 mmol/L (ref 3.5–5.3)
SODIUM: 141 mmol/L (ref 135–146)
Total Bilirubin: 0.3 mg/dL (ref 0.2–1.2)
Total Protein: 6.8 g/dL (ref 6.1–8.1)

## 2016-04-23 LAB — GC/CHLAMYDIA PROBE AMP
CT PROBE, AMP APTIMA: NOT DETECTED
GC Probe RNA: NOT DETECTED

## 2016-04-23 LAB — HEPATITIS C ANTIBODY: HCV Ab: NEGATIVE

## 2016-04-23 LAB — RPR

## 2016-04-23 LAB — VITAMIN D 25 HYDROXY (VIT D DEFICIENCY, FRACTURES): Vit D, 25-Hydroxy: 33 ng/mL (ref 30–100)

## 2016-04-23 LAB — HIV ANTIBODY (ROUTINE TESTING W REFLEX): HIV: NONREACTIVE

## 2016-05-17 ENCOUNTER — Ambulatory Visit (HOSPITAL_BASED_OUTPATIENT_CLINIC_OR_DEPARTMENT_OTHER): Payer: BLUE CROSS/BLUE SHIELD | Attending: Family Medicine

## 2016-05-18 ENCOUNTER — Telehealth: Payer: Self-pay | Admitting: Family Medicine

## 2016-05-18 DIAGNOSIS — G478 Other sleep disorders: Secondary | ICD-10-CM

## 2016-05-18 DIAGNOSIS — F5104 Psychophysiologic insomnia: Secondary | ICD-10-CM | POA: Insufficient documentation

## 2016-05-18 NOTE — Telephone Encounter (Signed)
Patient requesting in lab sleep study per sleep clinic

## 2016-06-20 ENCOUNTER — Ambulatory Visit (HOSPITAL_BASED_OUTPATIENT_CLINIC_OR_DEPARTMENT_OTHER): Payer: BLUE CROSS/BLUE SHIELD | Attending: Family Medicine | Admitting: Internal Medicine

## 2016-06-20 VITALS — Ht 62.0 in | Wt 180.0 lb

## 2016-06-20 DIAGNOSIS — R5383 Other fatigue: Secondary | ICD-10-CM | POA: Insufficient documentation

## 2016-06-20 DIAGNOSIS — I493 Ventricular premature depolarization: Secondary | ICD-10-CM | POA: Diagnosis not present

## 2016-06-20 DIAGNOSIS — Z79899 Other long term (current) drug therapy: Secondary | ICD-10-CM | POA: Insufficient documentation

## 2016-06-20 DIAGNOSIS — R0683 Snoring: Secondary | ICD-10-CM | POA: Diagnosis not present

## 2016-06-20 DIAGNOSIS — Z6833 Body mass index (BMI) 33.0-33.9, adult: Secondary | ICD-10-CM | POA: Insufficient documentation

## 2016-06-20 DIAGNOSIS — E669 Obesity, unspecified: Secondary | ICD-10-CM | POA: Diagnosis not present

## 2016-06-26 DIAGNOSIS — E669 Obesity, unspecified: Secondary | ICD-10-CM | POA: Diagnosis not present

## 2016-06-26 NOTE — Procedures (Signed)
  Patient Name: Tabitha Mcdonald, Tabitha Mcdonald Study Date: 06/20/2016 Gender: Female D.O.B: June 14, 1956 Age (years): [age] Referring Provider: Laren BoomSean Hommel Height (inches): 62 Interpreting Physician: Jetty Duhamellinton Young MD, ABSM Weight (lbs): 180 RPSGT: Cherylann ParrDubili, Fred BMI: 33 MRN: [id number] Neck Size: 14.50 CLINICAL INFORMATION Sleep Study Type: NPSG Indication for sleep study: Fatigue, Snoring, Witnessed Apneas Epworth Sleepiness Score: 0  SLEEP STUDY TECHNIQUE As per the AASM Manual for the Scoring of Sleep and Associated Events v2.3 (April 2016) with a hypopnea requiring 4% desaturations. The channels recorded and monitored were frontal, central and occipital EEG, electrooculogram (EOG), submentalis EMG (chin), nasal and oral airflow, thoracic and abdominal wall motion, anterior tibialis EMG, snore microphone, electrocardiogram, and pulse oximetry.  MEDICATIONS Patient's medications include: charted for review. Medications self-administered by patient during sleep study : Sleep medicine administered - Ambien 10 mg at 09:00:27 PM  SLEEP ARCHITECTURE The study was initiated at 9:40:02 PM and ended at 4:56:58 AM. Sleep onset time was 3.1 minutes and the sleep efficiency was 92.0%. The total sleep time was 401.8 minutes. Stage REM latency was 144.5 minutes. The patient spent 4.23% of the night in stage N1 sleep, 68.52% in stage N2 sleep, 7.59% in stage N3 and 19.66% in REM. Alpha intrusion was absent. Supine sleep was 66.40%. Wake after sleep onset 32 minutes  RESPIRATORY PARAMETERS The overall apnea/hypopnea index (AHI) was 2.5 per hour. There were 9 total apneas, including 5 obstructive, 4 central and 0 mixed apneas. There were 8 hypopneas and 0 RERAs. The AHI during Stage REM sleep was 7.6 per hour. AHI while supine was 3.1 per hour. The mean oxygen saturation was 94.96%. The minimum SpO2 during sleep was 89.00%. Moderate snoring was noted during this study.  CARDIAC DATA The 2 lead EKG  demonstrated sinus rhythm. The mean heart rate was 75.47 beats per minute. Other EKG findings include: PVCs.  LEG MOVEMENT DATA The total PLMS were 51 with a resulting PLMS index of 7.62. Associated arousal with leg movement index was 0.0 .  IMPRESSIONS - No significant obstructive sleep apnea occurred during this study (AHI = 2.5/h). - No significant central sleep apnea occurred during this study (CAI = 0.6/h). - The patient had minimal or no oxygen desaturation during the study (Min O2 = 89.00%) - The patient snored with Moderate snoring volume. - No cardiac abnormalities were noted during this study. - Mild periodic limb movements of sleep occurred during the study. No significant associated arousals.  DIAGNOSIS - Normal study  RECOMMENDATIONS - Avoid alcohol, sedatives and other CNS depressants that may worsen sleep apnea and disrupt normal sleep architecture. - Sleep hygiene should be reviewed to assess factors that may improve sleep quality. - Weight management and regular exercise should be initiated or continued if appropriate.  [Electronically signed] 06/26/2016 09:39 AM  Jetty Duhamellinton Young MD, ABSM Diplomate, American Board of Sleep Medicine   NPI: [id number]  Waymon BudgeYOUNG,CLINTON D Diplomate, American Board of Sleep Medicine  ELECTRONICALLY SIGNED ON:  06/26/2016, 9:36 AM [facility] PH: [phone]   FX: [phone] ACCREDITED BY THE AMERICAN ACADEMY OF SLEEP MEDICINE

## 2016-06-29 ENCOUNTER — Telehealth: Payer: Self-pay | Admitting: Family Medicine

## 2016-06-29 DIAGNOSIS — G4761 Periodic limb movement disorder: Secondary | ICD-10-CM

## 2016-06-29 DIAGNOSIS — G478 Other sleep disorders: Secondary | ICD-10-CM

## 2016-06-29 NOTE — Telephone Encounter (Signed)
Pt.notified

## 2016-06-29 NOTE — Telephone Encounter (Signed)
A referral to a sleep specialist seems reasonable, i'll put an order for this.

## 2016-09-15 ENCOUNTER — Encounter: Payer: Self-pay | Admitting: Pulmonary Disease

## 2016-09-15 ENCOUNTER — Ambulatory Visit (INDEPENDENT_AMBULATORY_CARE_PROVIDER_SITE_OTHER): Payer: BLUE CROSS/BLUE SHIELD | Admitting: Pulmonary Disease

## 2016-09-15 DIAGNOSIS — F5104 Psychophysiologic insomnia: Secondary | ICD-10-CM | POA: Diagnosis not present

## 2016-09-15 DIAGNOSIS — Z23 Encounter for immunization: Secondary | ICD-10-CM

## 2016-09-15 MED ORDER — ZOLPIDEM TARTRATE 10 MG PO TABS: 10.0000 mg | ORAL_TABLET | Freq: Every day | ORAL | 2 refills | Status: DC

## 2016-09-15 NOTE — Assessment & Plan Note (Signed)
Rules of sleep hygiene were discussed  - light exercise -avoid caffeinated beverages after 10 am - no more than 20 mins staying awake in bed, if not asleep, get out of bed & reading or light music - No TV or computer games at bedtime.  RX for ambien 10 mg  At bedtime #30 x 2 refills Contact PCP for further refils. She seems to have tolerated this well without hangover in the morning or sleep eating behaviors  COntact behaviour therapist for further treatment of insomnia with cognitive behavior therapy  She does not seem to have limb movement disorder or sleep apnea based on sleep study

## 2016-09-15 NOTE — Patient Instructions (Addendum)
Rules of sleep hygiene were discussed  - light exercise -avoid caffeinated beverages after 10 am - no more than 20 mins staying awake in bed, if not asleep, get out of bed & reading or light music - No TV or computer games at bedtime.  RX for ambien 10 mg  At bedtime #30 x 2 refills Contact PCP for further refils  COntact behaviour therapist for further treatment of insomnia

## 2016-09-15 NOTE — Progress Notes (Signed)
Subjective:    Patient ID: Tabitha Mcdonald, female    DOB: 11-24-1956, 60 y.o.   MRN: [id number]  HPI  Chief Complaint  Patient presents with  . Sleep Consult    Self referral.  trouble resting, mind racing at night. Sleep Study done 06/26/16 ES: 322    60 year old referred for evaluation of insomnia. 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 She reports that of a child with Down syndrome in 2009 and onset of insomnia since then. She has difficulty falling asleep and maintaining asleep. She feels like her body is resting but her mind is racing when she is awake. She is a sedentary lifestyle. She gets off of work at [employer]10 PM which is home by 10:30 and then catches up with her emails or texts on her phone. She turns on the TV at bedtime in her bedroom and stays in bed until Tuesday to fall asleep she takes her Ambien around 10:30 and it takes her about an hour to fall asleep. Due to neck surgery in the past she sleeps on her left side with one pillow. She reports one nocturnal awakening occasionally for nocturia and is out of bed by 7 to 7:30 AM feeling tired without dryness of mouth or headaches. She's gained about 25 pounds in the last few years. She drinks 2-3 cups of coffee before 9 AM. She denies afternoon naps are daytime somnolence or problems driving Epworth sleepiness score is 2. She is able to maintain the same schedule on weekends  She's been on Ambien since 2009 and denies any problems with sleep eating behaviors. She tried [place]unesta prior to this and melatonin and different kinds of these without any benefit  She would like to get of Ambien but feels that she just cannot sleep for 2 or 3 nights and then she crashes  PSG was reviewed and this showed adequate sleep time after she took Ambien of 400  minutes, AHI was 2.5/hour without significant positional component tabulated AHI was 7/hour. The limb movements were noted but these were not associated with arousals   Past Medical History:  Diagnosis Date  . Back pain   . Cervical disc disorder with radiculopathy 01/21/2015  . GERD (gastroesophageal reflux disease)    Past Surgical History:  Procedure Laterality Date  . ABDOMINAL HYSTERECTOMY    . BACK SURGERY    . CESAREAN SECTION    . TONSILLECTOMY     Allergies  Allergen Reactions  . Amoxicillin Other (See Comments)    Yeast infection      Social History   Social History  . Marital status: Single    Spouse name: N/A  . Number of children: N/A  . Years of education: N/A   Occupational History  . driver    Social History Main Topics  . Smoking status: Never Smoker  . Smokeless tobacco: Never Used  . Alcohol use No  . Drug use: No  . Sexual activity: No   Other Topics Concern  . Not on file   Social History Narrative  . No narrative on file     Family History  Problem Relation Age of Onset  . Hypertension Father   . Diabetes Son  Review of Systems  Constitutional: Negative for chills, fever and unexpected weight change.  HENT: Negative for congestion, dental problem, ear pain, nosebleeds, postnasal drip, rhinorrhea, sinus pressure, sneezing, sore throat, trouble swallowing and voice change.   Eyes: Negative for visual disturbance.  Respiratory: Negative for cough, choking and shortness of breath.   Cardiovascular: Negative for chest pain and leg swelling.  Gastrointestinal: Negative for abdominal pain, diarrhea and vomiting.  Genitourinary: Negative for difficulty urinating.  Musculoskeletal: Negative for arthralgias.  Skin: Negative for rash.  Neurological: Negative for tremors, syncope and headaches.  Hematological: Does not bruise/bleed easily.       Objective:   Physical Exam  Gen. Pleasant, well-nourished, in no distress, normal  affect ENT - no lesions, no post nasal drip Neck: No JVD, no thyromegaly, no carotid bruits Lungs: no use of accessory muscles, no dullness to percussion, clear without rales or rhonchi  Cardiovascular: Rhythm regular, heart sounds  normal, no murmurs or gallops, no peripheral edema Abdomen: soft and non-tender, no hepatosplenomegaly, BS normal. Musculoskeletal: No deformities, no cyanosis or clubbing Neuro:  alert, non focal       Assessment & Plan:

## 2016-10-15 ENCOUNTER — Ambulatory Visit: Payer: Self-pay | Admitting: Obstetrics & Gynecology

## 2016-10-18 ENCOUNTER — Ambulatory Visit (INDEPENDENT_AMBULATORY_CARE_PROVIDER_SITE_OTHER): Payer: BLUE CROSS/BLUE SHIELD | Admitting: Obstetrics & Gynecology

## 2016-10-18 ENCOUNTER — Encounter: Payer: Self-pay | Admitting: Obstetrics & Gynecology

## 2016-10-18 VITALS — BP 121/82 | HR 82 | Ht 62.0 in | Wt 186.0 lb

## 2016-10-18 DIAGNOSIS — Z01419 Encounter for gynecological examination (general) (routine) without abnormal findings: Secondary | ICD-10-CM | POA: Diagnosis not present

## 2016-10-18 DIAGNOSIS — R350 Frequency of micturition: Secondary | ICD-10-CM

## 2016-10-18 DIAGNOSIS — Z113 Encounter for screening for infections with a predominantly sexual mode of transmission: Secondary | ICD-10-CM

## 2016-10-18 MED ORDER — ESTRADIOL 0.025 MG/24HR TD PTTW: 1.0000 | MEDICATED_PATCH | TRANSDERMAL | 12 refills | Status: DC

## 2016-10-18 MED ORDER — ZOLPIDEM TARTRATE 10 MG PO TABS: 5.0000 mg | ORAL_TABLET | Freq: Every day | ORAL | 5 refills | Status: DC

## 2016-10-18 MED ORDER — VALACYCLOVIR HCL 1 G PO TABS: 2000.0000 mg | ORAL_TABLET | Freq: Every day | ORAL | 12 refills | Status: AC

## 2016-10-18 MED ORDER — ZOLPIDEM TARTRATE 10 MG PO TABS: 5.0000 mg | ORAL_TABLET | Freq: Every day | ORAL | 2 refills | Status: DC
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
## 2016-10-19 ENCOUNTER — Telehealth: Payer: Self-pay

## 2016-10-19 LAB — LIPID PANEL
CHOL/HDL RATIO: 3.3 ratio (ref <5.0)
CHOLESTEROL: 176 mg/dL (ref <200)
HDL: 54 mg/dL (ref >50)
LDL Cholesterol: 96 mg/dL
Triglycerides: 131 mg/dL (ref <150)
VLDL: 26 mg/dL (ref <30)

## 2016-10-19 LAB — HEPATITIS C ANTIBODY: HCV AB: NEGATIVE

## 2016-10-19 LAB — HIV ANTIBODY (ROUTINE TESTING W REFLEX): HIV: NONREACTIVE

## 2016-10-19 LAB — RPR

## 2016-10-19 LAB — HEPATITIS B SURFACE ANTIGEN: HEP B S AG: NEGATIVE

## 2016-10-19 NOTE — Telephone Encounter (Signed)
Left message on pt's phone letting her know that labs are normal

## 2016-10-20 LAB — GC/CHLAMYDIA PROBE AMP (~~LOC~~) NOT AT ARMC
CHLAMYDIA, DNA PROBE: NEGATIVE
NEISSERIA GONORRHEA: NEGATIVE

## 2016-11-12 ENCOUNTER — Emergency Department (INDEPENDENT_AMBULATORY_CARE_PROVIDER_SITE_OTHER)
Admission: EM | Admit: 2016-11-12 | Discharge: 2016-11-12 | Disposition: A | Discharge status: Home / Self Care | Payer: BLUE CROSS/BLUE SHIELD | Source: Home / Self Care | Attending: Family Medicine | Admitting: Family Medicine

## 2016-11-12 ENCOUNTER — Encounter: Payer: Self-pay | Admitting: *Deleted

## 2016-11-12 DIAGNOSIS — T2123XA Burn of second degree of upper back, initial encounter: Secondary | ICD-10-CM | POA: Diagnosis not present

## 2016-11-12 MED ORDER — SILVER SULFADIAZINE 1 % EX CREA: TOPICAL_CREAM | CUTANEOUS | 0 refills | Status: DC
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
## 2016-11-17 ENCOUNTER — Other Ambulatory Visit: Payer: Self-pay | Admitting: Obstetrics & Gynecology

## 2016-11-17 DIAGNOSIS — Z1231 Encounter for screening mammogram for malignant neoplasm of breast: Secondary | ICD-10-CM

## 2016-11-25 ENCOUNTER — Other Ambulatory Visit: Payer: Self-pay | Admitting: Pulmonary Disease

## 2016-11-25 NOTE — Telephone Encounter (Signed)
Attempted to contact pt. No answer, no option to leave a message. Will try back.  At her last OV, RA gave her a prescription for Ambien #30 with 2 refills.

## 2016-11-26 NOTE — Telephone Encounter (Signed)
Called and spoke with the pharmacy and they stated that the pt last filled the zolpidem on 11/20 for #15.  RA please advise if you are ok to refill this medication for the pt.  Thanks  Allergies  Allergen Reactions  . Amoxicillin Rash

## 2016-11-29 NOTE — Telephone Encounter (Signed)
Please honor initial prescription which said 2 refills Further refills from PCP or from behavior insomnia therapist

## 2016-12-01 NOTE — Telephone Encounter (Signed)
lmomtcb x1 

## 2016-12-01 NOTE — Telephone Encounter (Signed)
Spoke with pharmacist  Pt took hand written rx from [facility] Oct visit [facility] and had it filled on 11/25/16  It has 2 refills on it  I spoke with the pt and she is aware to contact her PCP or see behavioral specialist for any additional refills

## 2016-12-01 NOTE — Telephone Encounter (Signed)
Pt returning call.Tabitha Mcdonald ° °

## 2016-12-22 ENCOUNTER — Ambulatory Visit: Payer: BLUE CROSS/BLUE SHIELD | Admitting: Physician Assistant

## 2016-12-29 ENCOUNTER — Ambulatory Visit: Payer: Self-pay | Admitting: Osteopathic Medicine

## 2016-12-30 ENCOUNTER — Inpatient Hospital Stay: Payer: Self-pay | Admitting: Physician Assistant

## 2016-12-30 ENCOUNTER — Ambulatory Visit
Admission: RE | Admit: 2016-12-30 | Discharge: 2016-12-30 | Disposition: A | Discharge status: Home / Self Care | Payer: BLUE CROSS/BLUE SHIELD | Source: Ambulatory Visit | Attending: Obstetrics & Gynecology | Admitting: Obstetrics & Gynecology

## 2016-12-30 DIAGNOSIS — Z1231 Encounter for screening mammogram for malignant neoplasm of breast: Secondary | ICD-10-CM

## 2017-01-05 ENCOUNTER — Inpatient Hospital Stay: Payer: Self-pay | Admitting: Physician Assistant

## 2017-01-24 ENCOUNTER — Ambulatory Visit (INDEPENDENT_AMBULATORY_CARE_PROVIDER_SITE_OTHER): Payer: BLUE CROSS/BLUE SHIELD | Admitting: Osteopathic Medicine

## 2017-01-24 ENCOUNTER — Emergency Department
Admission: EM | Admit: 2017-01-24 | Discharge: 2017-01-24 | Discharge status: Absent without leave | Payer: BLUE CROSS/BLUE SHIELD | Source: Home / Self Care

## 2017-01-24 ENCOUNTER — Encounter: Payer: Self-pay | Admitting: Osteopathic Medicine

## 2017-01-24 VITALS — BP 132/80 | HR 93 | Temp 99.7°F | Ht 63.0 in | Wt 189.0 lb

## 2017-01-24 DIAGNOSIS — B9789 Other viral agents as the cause of diseases classified elsewhere: Secondary | ICD-10-CM

## 2017-01-24 DIAGNOSIS — R69 Illness, unspecified: Secondary | ICD-10-CM | POA: Diagnosis not present

## 2017-01-24 DIAGNOSIS — J988 Other specified respiratory disorders: Secondary | ICD-10-CM | POA: Diagnosis not present

## 2017-01-24 DIAGNOSIS — J111 Influenza due to unidentified influenza virus with other respiratory manifestations: Secondary | ICD-10-CM

## 2017-01-24 LAB — POCT INFLUENZA A/B
Influenza A, POC: NEGATIVE
Influenza B, POC: NEGATIVE

## 2017-01-24 MED ORDER — GUAIFENESIN-CODEINE 100-10 MG/5ML PO SYRP: 5.0000 mL | ORAL_SOLUTION | Freq: Three times a day (TID) | ORAL | 0 refills | Status: DC | PRN

## 2017-01-24 MED ORDER — IPRATROPIUM BROMIDE 0.03 % NA SOLN: 2.0000 | Freq: Four times a day (QID) | NASAL | 0 refills | Status: DC

## 2017-01-24 NOTE — Progress Notes (Signed)
HPI: Tabitha AblesMichele A Mcdonald is a 61 y.o. female who presents to [facility] [place] [facility] for chief complaint of:  Chief Complaint  Patient presents with  . Other    SWITCH FROM HOMMEL/ FLU SYMPTOMS    Acute Illness: . Context: has had flu shot this season, no known flu contacts but deals a lot with the public, works as Midwifebus driver.  . Quality: fatigue, cough, loose stool, muscle aches . Duration: 2 days . Modifying factors: Mucinex not helping   Past medical, social and family history reviewed. Immune compromising conditions or other risk factors: none  Current medications and allergies reviewed.     Review of Systems:  Constitutional: chills  HEENT: Yes  headache, Yes  sore throat, No  swollen glands  Cardiovascular: No chest pain  Respiratory:Yes  cough, No  shortness of breath  Gastrointestinal: Yes  nausea, No  vomiting,  Yes  diarrhea  Musculoskeletal:   Yes  myalgia/arthralgia  Skin/Integument:  No  rash   Detailed Exam:  BP 132/80   Pulse 93   Temp 99.7 F (37.6 C) (Oral)   Ht 5\' 3"  (1.6 m)   Wt 189 lb (85.7 kg)   BMI 33.48 kg/m   Constitutional:   VSS, see above.   General Appearance: alert, well-developed, well-nourished, NAD  Eyes:   Normal lids and conjunctive, non-icteric sclera  Ears, Nose, Mouth, Throat:   Normal external inspection ears/nares  Normal mouth/lips/gums, MMM  normal TM  posterior pharynx with erythema, without exudate  nasal mucosa normal  Skin:  Normal inspection, no rash or concerning lesions noted on limited exam  Neck:   No masses, trachea midline. normal lymph nodes  Respiratory:   Normal respiratory effort.   No  wheeze/rhonchi/rales  Cardiovascular:   S1/S2 normal, no murmur/rub/gallop auscultated. RRR.   Results for orders placed or performed in visit on 01/24/17 (from the past 72 hour(s))  POCT Influenza A/B     Status: None   Collection Time: 01/24/17  2:36 PM   Result Value Ref Range   Influenza A, POC Negative Negative   Influenza B, POC Negative Negative   No results found.   ASSESSMENT/PLAN: Supportive care, rest, hydration, work note   Influenza-like illness - Plan: POCT Influenza A/B, ipratropium (ATROVENT) 0.03 % nasal spray, guaiFENesin-codeine (ROBITUSSIN AC) 100-10 MG/5ML syrup  Viral respiratory infection - Plan: ipratropium (ATROVENT) 0.03 % nasal spray, guaiFENesin-codeine (ROBITUSSIN AC) 100-10 MG/5ML syrup     Visit summary was printed for the patient with medications and pertinent instructions for patient to review. ER/RTC precautions reviewed. All questions answered. Return if symptoms worsen or fail to improve.

## 2017-01-24 NOTE — Patient Instructions (Addendum)
Note: the following list assumes no pregnancy, normal liver & kidney function and no other drug interactions. Dr. Lyn HollingsheadAlexander has highlighted medications which are safe for you to use, but these may not be appropriate for everyone. Always ask a pharmacist or qualified medical provider if there are any questions!    Aches/Pains, Fever Acetaminophen (Tylenol) 500 mg tablets - take max 2 tablets (1000 mg) every 6 hours (4 times per day). *Caution if kidney problems Ibuprofen (Motrin) 200 mg tablets - take max 4 tablets (800 mg) every 6 hours. *Caution if high blood pressure or liver problems  Sinus Congestion Prescription Ipratropium Nasal spray (Atrovent) 1 spray in each nostril 3-4 times per day Cromolyn Nasal Spray (NasalCrom) 1 spray each nostril 3-4 times per day, max 6 times per day Nasal Saline if desired to rinse nasal passages Oxymetolazone (Afrin, others) sparing use due to rebound congestion Phenylephrine (Sudafed) for congestion/sinus pressure, 10 mg tablets every 4 hours (or the 12-hour formulation). *Caution if high blood pressure Diphenhydramine (Benadryl) for runny nose, 25 mg tablets - take max 2 tablets every 4 hours. *Caution in the elderly population  Cough & Sore Throat  Prescription cough pills or syrups as directed by a medical professional   Dextromethorphan (Robitussin, others) - cough suppressant  Guaifenesin (Robitussin, Mucinex, others) - expectorant (helps cough up mucus, but you need to drink this with LOTS of water)  (Dextromethorphan and Guaifenesin also come in a combination tablet)  Lozenges w/ Benzocaine + Menthol (Cepacol)  Honey - as much as you want! Teaspoon at a time, can mix with hot water/tea or lemon juice  Teas which "coat the throat" - look for ingredients Elm Bark, Licorice Root, Marshmallow Root  Other Zinc Lozenges within 24 hours of symptoms onset - mixed evidence this shortens the duration of the common cold Don't waste your money on  Vitamin C or Echinacea Prescription Oseltamivir (Tamiflu) for influenza - note, this will NOT treat symptoms, it shortens course of flu by one day or so if started within 48 hours of symptoms, and it can prevent the flu in people who have been exposed to the flu if started within 48 hours of exposure. Most people will not benefit form this medicine, but we recommend it in vulnerable populations such as pediatric patients, elderly patients, pregnant pations or immune-compromised patients.

## 2017-01-26 ENCOUNTER — Telehealth: Payer: Self-pay | Admitting: Osteopathic Medicine

## 2017-01-26 NOTE — Telephone Encounter (Signed)
Patient called adv that she was seen Monday 01/24/17 and is still not feeling well she was only wrote out of work for 1-3 days and cough is worst. Pt does sound very bad when speaking over phone and need more time excused. Please adv

## 2017-01-26 NOTE — Telephone Encounter (Signed)
Okay to provide work note for longer absence. If she is worse, we may need to have her return to clinic for chest xray.

## 2017-01-27 ENCOUNTER — Ambulatory Visit (INDEPENDENT_AMBULATORY_CARE_PROVIDER_SITE_OTHER): Payer: BLUE CROSS/BLUE SHIELD | Admitting: Osteopathic Medicine

## 2017-01-27 ENCOUNTER — Telehealth: Payer: Self-pay | Admitting: Osteopathic Medicine

## 2017-01-27 ENCOUNTER — Ambulatory Visit (INDEPENDENT_AMBULATORY_CARE_PROVIDER_SITE_OTHER): Payer: BLUE CROSS/BLUE SHIELD

## 2017-01-27 ENCOUNTER — Ambulatory Visit: Payer: Self-pay | Admitting: Physician Assistant

## 2017-01-27 ENCOUNTER — Encounter: Payer: Self-pay | Admitting: Osteopathic Medicine

## 2017-01-27 VITALS — BP 142/84 | HR 75 | Ht 63.0 in | Wt 190.0 lb

## 2017-01-27 DIAGNOSIS — R05 Cough: Secondary | ICD-10-CM

## 2017-01-27 DIAGNOSIS — R059 Cough, unspecified: Secondary | ICD-10-CM

## 2017-01-27 DIAGNOSIS — R0989 Other specified symptoms and signs involving the circulatory and respiratory systems: Secondary | ICD-10-CM

## 2017-01-27 MED ORDER — ALBUTEROL SULFATE HFA 108 (90 BASE) MCG/ACT IN AERS: 2.0000 | INHALATION_SPRAY | Freq: Four times a day (QID) | RESPIRATORY_TRACT | 0 refills | Status: DC | PRN

## 2017-01-27 MED ORDER — PREDNISONE 20 MG PO TABS: 20.0000 mg | ORAL_TABLET | Freq: Two times a day (BID) | ORAL | 0 refills | Status: DC

## 2017-01-27 MED ORDER — AZITHROMYCIN 250 MG PO TABS: ORAL_TABLET | ORAL | 0 refills | Status: DC

## 2017-01-27 MED ORDER — FLUTICASONE-SALMETEROL 115-21 MCG/ACT IN AERO: 2.0000 | INHALATION_SPRAY | Freq: Two times a day (BID) | RESPIRATORY_TRACT | 0 refills | Status: DC

## 2017-01-27 NOTE — Progress Notes (Signed)
HPI: Tabitha Mcdonald is a 61 y.o. female who presents to [facility] 01/27/17 for chief complaint of:  Chief Complaint  Patient presents with  . Epistaxis    patient stated that she started having nosebleeds after using Atrovent nasal spray    Acute Illness: . 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 Pt not feeling better - feeling about the same, worried about "wheezing" described as a gurgling in the lungs on occasion. . Quality: fatigue, cough, loose stool, muscle aches . Duration: 5 days . Modifying factors: Mucinex not helping, Atrovent caused nosebleed, cough medicine somewhat helpful.    Past medical, social and family history reviewed. Immune compromising conditions or other risk factors: none  Current medications and allergies reviewed.     Review of Systems:  Constitutional: chills somewhat better now  HEENT: Yes  headache, Yes  sore throat, No  swollen glands  Cardiovascular: No chest pain  Respiratory:Yes  cough, Some shortness of breath/wheeze  Gastrointestinal: Yes  nausea, No  vomiting,  No diarrhea  Musculoskeletal:   Yes  myalgia/arthralgia  Skin/Integument:  No  rash   Detailed Exam:  BP (!) 142/84   Pulse 75   Ht 5\' 3"  (1.6 m)   Wt 190 lb (86.2 kg)   BMI 33.66 kg/m   Constitutional:   VSS, see above.   General Appearance: alert, well-developed, well-nourished, NAD  Eyes:   Normal lids and conjunctive, non-icteric sclera  Ears, Nose, Mouth, Throat:   Normal external inspection ears/nares  Normal mouth/lips/gums, MMM  normal TM  posterior pharynx with erythema, without exudate  nasal mucosa normal  Skin:  Normal inspection, no rash or concerning lesions noted on limited exam  Neck:   No masses, trachea midline. normal lymph nodes  Respiratory:   Normal  respiratory effort.   No  wheeze/rhonchi/rales  Cardiovascular:   S1/S2 normal, no murmur/rub/gallop auscultated. RRR.   Results for orders placed or performed in visit on 01/24/17 (from the past 72 hour(s))  POCT Influenza A/B     Status: None   Collection Time: 01/24/17  2:36 PM  Result Value Ref Range   Influenza A, POC Negative Negative   Influenza B, POC Negative Negative   Dg Chest 2 View  Result Date: 01/27/2017 CLINICAL DATA:  Cough and congestion EXAM: CHEST  2 VIEW COMPARISON:  None. FINDINGS: There is no edema or consolidation. Heart size and pulmonary vascularity are normal. No adenopathy. There is postoperative change in the lower cervical region. There is mild degenerative change in the thoracic spine. IMPRESSION: No edema or consolidation. Electronically Signed   By: Bretta Bang III M.D.   On: 01/27/2017 09:52     ASSESSMENT/PLAN: Supportive care, rest, hydration, work note at last visit. Pt still within 5-7 day window for viral infection, and I am more suspicious of viral problem than bacterial CXR reviewed, awaiting over-read As of patient discharge from the clinic. Advised though antibiotics if no better after total of 7 days of symptoms, sooner if worse/fever/increased shortness of breath that if that does happen she would need to come here for repeat chest x-ray and further evaluation  Cough - Plan: DG Chest 2 View, predniSONE (DELTASONE) 20 MG tablet, azithromycin (ZITHROMAX) 250 MG tablet, DISCONTINUED: fluticasone-salmeterol (ADVAIR HFA) 115-21 MCG/ACT inhaler     Visit summary was printed for  the patient with medications and pertinent instructions for patient to review. ER/RTC precautions reviewed. All questions answered. Return if symptoms worsen or fail to improve.

## 2017-01-27 NOTE — Telephone Encounter (Signed)
Sent!

## 2017-01-27 NOTE — Telephone Encounter (Signed)
Pt states the Rx for ADVAIR HFA was too expensive, request the rescue inhaler instead. [facility] [place] [facility] [address]

## 2017-03-14 ENCOUNTER — Other Ambulatory Visit: Payer: Self-pay | Admitting: Osteopathic Medicine

## 2017-03-14 DIAGNOSIS — J111 Influenza due to unidentified influenza virus with other respiratory manifestations: Secondary | ICD-10-CM

## 2017-03-14 DIAGNOSIS — R69 Illness, unspecified: Principal | ICD-10-CM

## 2017-03-14 DIAGNOSIS — B9789 Other viral agents as the cause of diseases classified elsewhere: Secondary | ICD-10-CM

## 2017-03-14 DIAGNOSIS — J988 Other specified respiratory disorders: Secondary | ICD-10-CM

## 2017-05-29 ENCOUNTER — Emergency Department (INDEPENDENT_AMBULATORY_CARE_PROVIDER_SITE_OTHER)
Admission: EM | Admit: 2017-05-29 | Discharge: 2017-05-29 | Disposition: A | Discharge status: Home / Self Care | Payer: BLUE CROSS/BLUE SHIELD | Source: Home / Self Care | Attending: Family Medicine | Admitting: Family Medicine

## 2017-05-29 ENCOUNTER — Encounter: Payer: Self-pay | Admitting: Emergency Medicine

## 2017-05-29 DIAGNOSIS — J029 Acute pharyngitis, unspecified: Secondary | ICD-10-CM | POA: Diagnosis not present

## 2017-05-29 DIAGNOSIS — H9203 Otalgia, bilateral: Secondary | ICD-10-CM

## 2017-05-29 DIAGNOSIS — H6122 Impacted cerumen, left ear: Secondary | ICD-10-CM

## 2017-05-29 MED ORDER — BENZOCAINE-MENTHOL 15-10 MG MT LOZG: 1.0000 | LOZENGE | Freq: Four times a day (QID) | OROMUCOSAL | 0 refills | Status: DC | PRN

## 2017-05-29 MED ORDER — CARBAMIDE PEROXIDE 6.5 % OT SOLN: 5.0000 [drp] | Freq: Two times a day (BID) | OTIC | 0 refills | Status: DC

## 2017-05-29 NOTE — ED Triage Notes (Signed)
[facility] with C/O sor throat and chills times 3 day with bilateral ear. Denies pain at this time. Patient unable to tolerate strep test.

## 2017-05-29 NOTE — Discharge Instructions (Signed)
° °  You may take 500mg  acetaminophen every 4-6 hours or in combination with ibuprofen 400-600mg  every 6-8 hours to help with pain and fever/chills.  Be sure to get at least 8 hours of sleep at night while sick and be sure to stay well hydrated.

## 2017-05-29 NOTE — ED Provider Notes (Signed)
CSN: [id number]     Arrival date & time 05/29/17  1102 History   First MD Initiated Contact with Patient 05/29/17 1121     Chief Complaint  Patient presents with  . Sore Throat   (Consider location/radiation/quality/duration/timing/severity/associated sxs/prior Treatment) HPI  Tabitha Mcdonald is a 61 y.o. female presenting to UC with c/o sore throat, chills, and bilateral ear pain for 3 days.  She denies cough or congestion but did feel some chest tightness yesterday.  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  By the time she got home she did not feel the need to use her inhaler. Denies sick contacts or recent travel.  She has not taken anything for pain but denies pain today.  She is most concerned about the chills despite it being 90*F outside.    Past Medical History:  Diagnosis Date  . Back pain   . Cervical disc disorder with radiculopathy 01/21/2015  . GERD (gastroesophageal reflux disease)    Past Surgical History:  Procedure Laterality Date  . ABDOMINAL HYSTERECTOMY    . BACK SURGERY    . CESAREAN SECTION    . TONSILLECTOMY     Family History  Problem Relation Age of Onset  . Hypertension Father   . Diabetes Son    Social History  Substance Use Topics  . Smoking status: Never Smoker  . Smokeless tobacco: Never Used  . Alcohol use No   OB History    Gravida Para Term Preterm AB Living   2 2       1    SAB TAB Ectopic Multiple Live Births                 Review of Systems  Constitutional: Negative for chills and fever.  HENT: Positive for ear pain (bilateral) and sore throat. Negative for congestion, trouble swallowing and voice change.   Respiratory: Negative for cough and shortness of breath.   Cardiovascular: Negative for chest pain and palpitations.  Gastrointestinal: Negative for abdominal pain, diarrhea, nausea and vomiting.  Musculoskeletal: Negative for arthralgias, back pain and  myalgias.  Skin: Negative for rash.  Neurological: Negative for dizziness, light-headedness and headaches.    Allergies  Patient has no active allergies.  Home Medications   Prior to Admission medications   Medication Sig Start Date End Date Taking? Authorizing Provider  albuterol (PROVENTIL HFA;VENTOLIN HFA) 108 (90 Base) MCG/ACT inhaler Inhale 2 puffs into the lungs every 6 (six) hours as needed for wheezing. 01/27/17   Sunnie Nielsen, DO  azithromycin (ZITHROMAX) 250 MG tablet 2 tabs po on Day 1, then 1 tab daily Days 2 - 5. Fill if cough persists 7+ days, if fever >101 01/27/17   Sunnie Nielsen, DO  Benzocaine-Menthol 15-10 MG LOZG Use as directed 1 lozenge in the mouth or throat 4 (four) times daily as needed. 05/29/17   Lurene Shadow, PA-C  carbamide peroxide (DEBROX) 6.5 % OTIC solution Place 5 drops into both ears 2 (two) times daily. 05/29/17   Lurene Shadow, PA-C  estradiol (VIVELLE-DOT) 0.025 MG/24HR Place 1 patch onto the skin 2 (two) times a week. 10/18/16   Dove, Leanora Ivanoff, MD  guaiFENesin-codeine (ROBITUSSIN AC) 100-10 MG/5ML syrup Take 5-10 mLs by mouth 3 (three) times daily as needed for cough. 01/24/17   Sunnie Nielsen, DO  predniSONE (DELTASONE) 20 MG tablet Take 1 tablet (20 mg total) by  mouth 2 (two) times daily with a meal. 01/27/17   Sunnie NielsenAlexander, Natalie, DO  silver sulfADIAZINE (SILVADENE) 1 % cream Apply thin layer 1-2 times daily for 1 week 11/12/16   Lurene ShadowPhelps, Bassem Bernasconi O, PA-C  zolpidem (AMBIEN) 10 MG tablet Take 0.5 tablets (5 mg total) by mouth at bedtime. 10/18/16   Allie Bossierove, Myra C, MD   Meds Ordered and Administered this Visit  Medications - No data to display  BP 129/83 (BP Location: Left Arm)   Pulse 75   Temp 98.8 F (37.1 C) (Oral)   Resp 16   Ht 5\' 2"  (1.575 m)   Wt 172 lb 8 oz (78.2 kg)   SpO2 99%   BMI 31.55 kg/m  No data found.   Physical Exam  Constitutional: She is oriented to person, place, and time. She appears well-developed and  well-nourished. No distress.  HENT:  Head: Normocephalic and atraumatic.  Right Ear: Tympanic membrane normal.  Nose: Nose normal.  Mouth/Throat: Uvula is midline, oropharynx is clear and moist and mucous membranes are normal.  Left ear- cerumen impaction.  After partial removal of cerumen, TM visible- normal  Eyes: EOM are normal.  Neck: Normal range of motion. Neck supple.  Cardiovascular: Normal rate and regular rhythm.   Pulmonary/Chest: Effort normal and breath sounds normal. No stridor. No respiratory distress. She has no wheezes. She has no rales.  Musculoskeletal: Normal range of motion.  Lymphadenopathy:    She has no cervical adenopathy.  Neurological: She is alert and oriented to person, place, and time.  Skin: Skin is warm and dry. She is not diaphoretic.  Psychiatric: She has a normal mood and affect. Her behavior is normal.  Nursing note and vitals reviewed.   Urgent Care Course     Procedures (including critical care time)  Labs Review Labs Reviewed - No data to display  Imaging Review No results found.   MDM   1. Viral pharyngitis   2. Acute ear pain, bilateral   3. Left ear impacted cerumen    Pt c/o sore throat and chills with bilateral ear pain. Oral exam unremarkable. Ear exam- cerumen impaction but normal TMs Lungs: CTAB  Symptoms likely viral in nature. Home care instructions provided. Encouraged fluids, rest, acetaminophen and ibuprofen Rx: Debrox and benzocaine/menthol throat lozenges     Lurene Shadowhelps, Vera Wishart O, [place]PA-C 05/29/17 1151

## 2017-06-24 ENCOUNTER — Other Ambulatory Visit: Payer: Self-pay | Admitting: Physician Assistant

## 2017-06-24 DIAGNOSIS — M5412 Radiculopathy, cervical region: Secondary | ICD-10-CM

## 2017-06-29 ENCOUNTER — Other Ambulatory Visit: Payer: Self-pay

## 2017-08-10 ENCOUNTER — Ambulatory Visit (INDEPENDENT_AMBULATORY_CARE_PROVIDER_SITE_OTHER): Payer: BLUE CROSS/BLUE SHIELD | Admitting: Specialist

## 2017-08-10 ENCOUNTER — Telehealth (INDEPENDENT_AMBULATORY_CARE_PROVIDER_SITE_OTHER): Payer: Self-pay | Admitting: Specialist

## 2017-08-10 NOTE — Telephone Encounter (Signed)
Patient was to come in to see Dr. Otelia SergeantNitka and didn't want to wait any longer but wanted to be rescheduled. CB # [phone]

## 2017-08-17 NOTE — Telephone Encounter (Signed)
Pt has an appt 09/26/17

## 2017-09-13 ENCOUNTER — Other Ambulatory Visit: Payer: Self-pay | Admitting: Neurosurgery

## 2017-09-14 ENCOUNTER — Ambulatory Visit (INDEPENDENT_AMBULATORY_CARE_PROVIDER_SITE_OTHER): Payer: BLUE CROSS/BLUE SHIELD | Admitting: Specialist

## 2017-09-14 ENCOUNTER — Encounter (INDEPENDENT_AMBULATORY_CARE_PROVIDER_SITE_OTHER): Payer: Self-pay

## 2017-09-23 NOTE — Pre-Procedure Instructions (Signed)
REGINAE WOLFREY  09/23/2017      [id number] [address] [place] [address] Phone: [phone] Fax: [phone]    Your procedure is scheduled on October 23  Report to [facility] Admitting at 1125 A.M.  Call this number if you have problems the morning of surgery:  [phone]   Remember:  Do not eat food or drink liquids after midnight.  Continue all other medications as directed by your physician except follow these medication instructions before surgery   Take these medicines the morning of surgery with A SIP OF WATER  albuterol (PROVENTIL HFA;VENTOLIN HFA) pantoprazole (PROTONIX) Eye drops  7 days prior to surgery STOP taking any Aspirin (unless otherwise instructed by your surgeon), Aleve, Naproxen, Ibuprofen, Motrin, Advil, Goody's, BC's, all herbal medications, fish oil, and all vitamins    Do not wear jewelry, make-up or nail polish.  Do not wear lotions, powders, or perfumes, or deoderant.  Do not shave 48 hours prior to surgery.  Men may shave face and neck.  Do not bring valuables to the hospital.  [facility] is not responsible for any belongings or valuables.  Contacts, dentures or bridgework may not be worn into surgery.  Leave your suitcase in the car.  After surgery it may be brought to your room.  For patients admitted to the hospital, discharge time will be determined by your treatment team.  Patients discharged the day of surgery will not be allowed to drive home.    Special instructions:   [place]- Preparing For Surgery  Before surgery, you can play an important role. Because skin is not sterile, your skin needs to be as free of germs as possible. You can reduce the number of germs on your skin by washing with CHG (chlorahexidine gluconate) Soap before surgery.  CHG is an antiseptic cleaner which kills germs and bonds with the skin to continue killing germs even after  washing.  Please do not use if you have an allergy to CHG or antibacterial soaps. If your skin becomes reddened/irritated stop using the CHG.  Do not shave (including legs and underarms) for at least 48 hours prior to first CHG shower. It is OK to shave your face.  Please follow these instructions carefully.   1. Shower the NIGHT BEFORE SURGERY and the MORNING OF SURGERY with CHG.   2. If you chose to wash your hair, wash your hair first as usual with your normal shampoo.  3. After you shampoo, rinse your hair and body thoroughly to remove the shampoo.  4. Use CHG as you would any other liquid soap. You can apply CHG directly to the skin and wash gently with a scrungie or a clean washcloth.   5. Apply the CHG Soap to your body ONLY FROM THE NECK DOWN.  Do not use on open wounds or open sores. Avoid contact with your eyes, ears, mouth and genitals (private parts). Wash Face and genitals (private parts)  with your normal soap.  6. Wash thoroughly, paying special attention to the area where your surgery will be performed.  7. Thoroughly rinse your body with warm water from the neck down.  8. DO NOT shower/wash with your normal soap after using and rinsing off the CHG Soap.  9. Pat yourself dry with a CLEAN TOWEL.  10. Wear CLEAN PAJAMAS to bed the night before surgery, wear comfortable clothes the morning of surgery  11. Place CLEAN  SHEETS on your bed the night of your first shower and DO NOT SLEEP WITH PETS.    Day of Surgery: Do not apply any deodorants/lotions. Please wear clean clothes to the hospital/surgery center.      Please read over the following fact sheets that you were given.

## 2017-09-26 ENCOUNTER — Encounter (HOSPITAL_COMMUNITY): Payer: Self-pay

## 2017-09-26 ENCOUNTER — Ambulatory Visit (INDEPENDENT_AMBULATORY_CARE_PROVIDER_SITE_OTHER): Payer: BLUE CROSS/BLUE SHIELD | Admitting: Specialist

## 2017-09-26 ENCOUNTER — Encounter (HOSPITAL_COMMUNITY)
Admission: RE | Admit: 2017-09-26 | Discharge: 2017-09-26 | Disposition: A | Discharge status: Home / Self Care | Payer: BLUE CROSS/BLUE SHIELD | Source: Ambulatory Visit | Attending: Neurosurgery | Admitting: Neurosurgery

## 2017-09-26 ENCOUNTER — Encounter (INDEPENDENT_AMBULATORY_CARE_PROVIDER_SITE_OTHER): Payer: Self-pay

## 2017-09-26 DIAGNOSIS — M4802 Spinal stenosis, cervical region: Secondary | ICD-10-CM | POA: Insufficient documentation

## 2017-09-26 HISTORY — DX: Personal history of other diseases of the digestive system: Z87.19

## 2017-09-26 HISTORY — DX: Unspecified asthma, uncomplicated: J45.909

## 2017-09-26 HISTORY — DX: Unspecified osteoarthritis, unspecified site: M19.90

## 2017-09-26 LAB — BASIC METABOLIC PANEL
ANION GAP: 7 (ref 5–15)
BUN: 8 mg/dL (ref 6–20)
CHLORIDE: 106 mmol/L (ref 101–111)
CO2: 27 mmol/L (ref 22–32)
Calcium: 9.3 mg/dL (ref 8.9–10.3)
Creatinine, Ser: 0.82 mg/dL (ref 0.44–1.00)
GFR calc non Af Amer: >60 mL/min (ref >60)
Glucose, Bld: 92 mg/dL (ref 65–99)
POTASSIUM: 4.2 mmol/L (ref 3.5–5.1)
SODIUM: 140 mmol/L (ref 135–145)

## 2017-09-26 LAB — CBC
HCT: 39.8 % (ref 36.0–46.0)
HEMOGLOBIN: 13 g/dL (ref 12.0–15.0)
MCH: 29.3 pg (ref 26.0–34.0)
MCHC: 32.7 g/dL (ref 30.0–36.0)
MCV: 89.6 fL (ref 78.0–100.0)
Platelets: 206 10*3/uL (ref 150–400)
RBC: 4.44 MIL/uL (ref 3.87–5.11)
RDW: 13.8 % (ref 11.5–15.5)
WBC: 4.1 10*3/uL (ref 4.0–10.5)

## 2017-09-26 LAB — SURGICAL PCR SCREEN
MRSA, PCR: POSITIVE — AB
STAPHYLOCOCCUS AUREUS: POSITIVE — AB

## 2017-09-26 LAB — TYPE AND SCREEN
ABO/RH(D): B POS
ANTIBODY SCREEN: NEGATIVE

## 2017-09-26 LAB — ABO/RH: ABO/RH(D): B POS

## 2017-09-26 NOTE — Progress Notes (Signed)
PCP is Sunnie Nielsen, OD from [facility]  LOV 01/2017 Tabitha Mcdonald is Dr. Serita Grammes.  LOV 07/2017 Patient states that back in Jan, this year, she came in with c/o chest pains.  States tests were done (i requested anything from [facility]), nothing cardiac related, but GERD, takes protonix.  No further c/o of cp, denies murmur. Does suffer from seasonal allergies and uses inhaler periodically.

## 2017-09-26 NOTE — Pre-Procedure Instructions (Signed)
Tabitha Mcdonald  09/26/2017      [id number] [address] [place] [address] Phone: [phone] Fax: [phone]    Your procedure is scheduled on October 23, Tuesday.   Report to [facility] Admitting at 1125 A.M.             (posted surgery time 1:25p - 2:50p)   Call this number if you have problems the MORNING  of surgery:  [phone].   Remember:   Do not eat food or drink liquids after midnight Monday.  Continue all other medications as directed by your physician except follow these medication instructions before surgery   Take these medicines the morning of surgery with A SIP OF WATER   albuterol (PROVENTIL HFA;VENTOLIN HFA) pantoprazole (PROTONIX) Eye drops  7 days prior to surgery STOP taking any Aspirin (unless otherwise instructed by your surgeon), Aleve, Naproxen, Ibuprofen, Motrin, Advil, Goody's, BC's, all herbal medications, fish oil, and all vitamins    Do not wear jewelry, make-up or nail polish.  Do not wear lotions, powders,  perfumes, or deoderant.  Do not shave 48 hours prior to surgery.     Do not bring valuables to the hospital.  [facility] is not responsible for any belongings or valuables.  Contacts, dentures or bridgework may not be worn into surgery.  Leave your suitcase in the car.  After surgery it may be brought to your room.  For patients admitted to the hospital, discharge time will be determined by your treatment team.   Special instructions:   [place]- Preparing For Surgery  Before surgery, you can play an important role. Because skin is not sterile, your skin needs to be as free of germs as possible. You can reduce the number of germs on your skin by washing with CHG (chlorahexidine gluconate) Soap before surgery.  CHG is an antiseptic cleaner which kills germs and bonds with the skin to continue killing germs even after washing.  Please do not use if you  have an allergy to CHG or antibacterial soaps. If your skin becomes reddened/irritated stop using the CHG.  Do not shave (including legs and underarms) for at least 48 hours prior to first CHG shower. It is OK to shave your face.  Please follow these instructions carefully.   1. Shower the NIGHT BEFORE SURGERY and the MORNING OF SURGERY with CHG.   2. If you chose to wash your hair, wash your hair first as usual with your normal shampoo.  3. After you shampoo, rinse your hair and body thoroughly to remove the shampoo.  4. Use CHG as you would any other liquid soap. You can apply CHG directly to the skin and wash gently with a scrungie or a clean washcloth.   5. Apply the CHG Soap to your body ONLY FROM THE NECK DOWN.  Do not use on open wounds or open sores. Avoid contact with your eyes, ears, mouth and genitals (private parts). Wash Face and genitals (private parts)  with your normal soap.  6. Wash thoroughly, paying special attention to the area where your surgery will be performed.  7. Thoroughly rinse your body with warm water from the neck down.  8. DO NOT shower/wash with your normal soap after using and rinsing off the CHG Soap.  9. Pat yourself dry with a CLEAN TOWEL.  10. Wear CLEAN PAJAMAS to bed the night before surgery, wear comfortable clothes the morning  of surgery  11. Place CLEAN SHEETS on your bed the night of your first shower and DO NOT SLEEP WITH PETS.    Day of Surgery: Do not apply any deodorants/lotions. Please wear clean clothes to the hospital/surgery center.      Please read over the following fact sheets that you were given.

## 2017-09-27 NOTE — Progress Notes (Signed)
Anesthesia Chart Review:  Pt is a 61 year old female scheduled for C7-T1 ACDF, possible hardware removal C5-7 on 10/04/2017 with Lisbeth Renshaw, MD  - PCP is Sunnie Nielsen, DO  PMH includes:  Asthma, GERD. Never smoker. BMI 31  - ED visit 12/22/16 for chest pain at [facility] (notes in care everywhere). EKG normal, troponins negative x2.  Thought to be GERD.   Medications include: albuterol, protonix  BP 131/76   Pulse 79   Temp 36.9 C   Resp 20   Ht  (1.626 m)   Wt 182 lb 9.6 oz (82.8 kg)   SpO2 100%   BMI 31.34 kg/m    Preoperative labs reviewed.    CXR 01/27/17: No edema or consolidation.  EKG 12/22/16 [facility]): sinus rhythm.   If no changes, I anticipate pt can proceed with surgery as scheduled.   Rica Mast, FNP-BC [facility]/Anesthesiology Phone: [phone] 09/27/2017 2:09 PM

## 2017-10-11 ENCOUNTER — Emergency Department (HOSPITAL_COMMUNITY): Payer: BLUE CROSS/BLUE SHIELD

## 2017-10-11 ENCOUNTER — Encounter (HOSPITAL_COMMUNITY): Payer: Self-pay

## 2017-10-11 ENCOUNTER — Other Ambulatory Visit: Payer: Self-pay

## 2017-10-11 ENCOUNTER — Encounter (HOSPITAL_COMMUNITY): Payer: Self-pay | Admitting: Emergency Medicine

## 2017-10-11 ENCOUNTER — Emergency Department (HOSPITAL_COMMUNITY)
Admission: EM | Admit: 2017-10-11 | Discharge: 2017-10-11 | Disposition: A | Discharge status: Home / Self Care | Payer: BLUE CROSS/BLUE SHIELD | Attending: Emergency Medicine | Admitting: Emergency Medicine

## 2017-10-11 ENCOUNTER — Emergency Department (HOSPITAL_COMMUNITY)
Admission: EM | Admit: 2017-10-11 | Discharge: 2017-10-11 | Disposition: A | Discharge status: Home / Self Care | Payer: BLUE CROSS/BLUE SHIELD | Source: Home / Self Care | Attending: Emergency Medicine | Admitting: Emergency Medicine

## 2017-10-11 DIAGNOSIS — J45909 Unspecified asthma, uncomplicated: Secondary | ICD-10-CM | POA: Insufficient documentation

## 2017-10-11 DIAGNOSIS — K219 Gastro-esophageal reflux disease without esophagitis: Secondary | ICD-10-CM | POA: Diagnosis not present

## 2017-10-11 DIAGNOSIS — Z79899 Other long term (current) drug therapy: Secondary | ICD-10-CM | POA: Insufficient documentation

## 2017-10-11 DIAGNOSIS — K8020 Calculus of gallbladder without cholecystitis without obstruction: Secondary | ICD-10-CM

## 2017-10-11 DIAGNOSIS — R197 Diarrhea, unspecified: Secondary | ICD-10-CM | POA: Insufficient documentation

## 2017-10-11 DIAGNOSIS — R1011 Right upper quadrant pain: Secondary | ICD-10-CM

## 2017-10-11 DIAGNOSIS — R0781 Pleurodynia: Secondary | ICD-10-CM

## 2017-10-11 DIAGNOSIS — K449 Diaphragmatic hernia without obstruction or gangrene: Secondary | ICD-10-CM | POA: Diagnosis not present

## 2017-10-11 DIAGNOSIS — R0789 Other chest pain: Secondary | ICD-10-CM | POA: Diagnosis present

## 2017-10-11 LAB — URINALYSIS, ROUTINE W REFLEX MICROSCOPIC
BILIRUBIN URINE: NEGATIVE
Glucose, UA: NEGATIVE mg/dL
HGB URINE DIPSTICK: NEGATIVE
Ketones, ur: NEGATIVE mg/dL
Leukocytes, UA: NEGATIVE
NITRITE: NEGATIVE
PH: 5 (ref 5.0–8.0)
Protein, ur: NEGATIVE mg/dL
SPECIFIC GRAVITY, URINE: 1.018 (ref 1.005–1.030)

## 2017-10-11 LAB — I-STAT TROPONIN, ED: Troponin i, poc: 0 ng/mL (ref 0.00–0.08)

## 2017-10-11 LAB — HEPATIC FUNCTION PANEL
ALBUMIN: 4.1 g/dL (ref 3.5–5.0)
ALK PHOS: 39 U/L (ref 38–126)
ALT: 29 U/L (ref 14–54)
ALT: 29 U/L (ref 14–54)
AST: 26 U/L (ref 15–41)
AST: 29 U/L (ref 15–41)
Albumin: 4.1 g/dL (ref 3.5–5.0)
Alkaline Phosphatase: 36 U/L — ABNORMAL LOW (ref 38–126)
BILIRUBIN TOTAL: 0.5 mg/dL (ref 0.3–1.2)
Bilirubin, Direct: <0.1 mg/dL — ABNORMAL LOW (ref 0.1–0.5)
Bilirubin, Direct: <0.1 mg/dL — ABNORMAL LOW (ref 0.1–0.5)
Total Bilirubin: 0.2 mg/dL — ABNORMAL LOW (ref 0.3–1.2)
Total Protein: 7.7 g/dL (ref 6.5–8.1)
Total Protein: 7.4 g/dL (ref 6.5–8.1)

## 2017-10-11 LAB — BASIC METABOLIC PANEL
Anion gap: 10 (ref 5–15)
Anion gap: 8 (ref 5–15)
BUN: 11 mg/dL (ref 6–20)
BUN: 9 mg/dL (ref 6–20)
CHLORIDE: 102 mmol/L (ref 101–111)
CHLORIDE: 105 mmol/L (ref 101–111)
CO2: 29 mmol/L (ref 22–32)
CO2: 29 mmol/L (ref 22–32)
CREATININE: 0.94 mg/dL (ref 0.44–1.00)
Calcium: 9.7 mg/dL (ref 8.9–10.3)
Calcium: 9.3 mg/dL (ref 8.9–10.3)
Creatinine, Ser: 0.89 mg/dL (ref 0.44–1.00)
GFR calc Af Amer: >60 mL/min (ref >60)
GFR calc non Af Amer: >60 mL/min (ref >60)
GFR calc non Af Amer: >60 mL/min (ref >60)
Glucose, Bld: 96 mg/dL (ref 65–99)
Glucose, Bld: 90 mg/dL (ref 65–99)
POTASSIUM: 3.7 mmol/L (ref 3.5–5.1)
Potassium: 4 mmol/L (ref 3.5–5.1)
Sodium: 141 mmol/L (ref 135–145)
Sodium: 142 mmol/L (ref 135–145)

## 2017-10-11 LAB — POCT I-STAT TROPONIN I: Troponin i, poc: 0 ng/mL (ref 0.00–0.08)

## 2017-10-11 LAB — D-DIMER, QUANTITATIVE: D-Dimer, Quant: 0.34 ug/mL-FEU (ref 0.00–0.50)

## 2017-10-11 LAB — CBC
HCT: 39 % (ref 36.0–46.0)
HEMATOCRIT: 40.2 % (ref 36.0–46.0)
Hemoglobin: 13.4 g/dL (ref 12.0–15.0)
Hemoglobin: 13 g/dL (ref 12.0–15.0)
MCH: 29.9 pg (ref 26.0–34.0)
MCH: 29.7 pg (ref 26.0–34.0)
MCHC: 33.3 g/dL (ref 30.0–36.0)
MCHC: 33.3 g/dL (ref 30.0–36.0)
MCV: 89.7 fL (ref 78.0–100.0)
MCV: 89 fL (ref 78.0–100.0)
PLATELETS: 202 10*3/uL (ref 150–400)
Platelets: 213 10*3/uL (ref 150–400)
RBC: 4.48 MIL/uL (ref 3.87–5.11)
RBC: 4.38 MIL/uL (ref 3.87–5.11)
RDW: 13.6 % (ref 11.5–15.5)
RDW: 13.6 % (ref 11.5–15.5)
WBC: 5.5 10*3/uL (ref 4.0–10.5)
WBC: 3.7 10*3/uL — ABNORMAL LOW (ref 4.0–10.5)

## 2017-10-11 LAB — LIPASE, BLOOD
LIPASE: 51 U/L (ref 11–51)
LIPASE: 39 U/L (ref 11–51)

## 2017-10-11 MED ORDER — ONDANSETRON HCL 4 MG/2ML IJ SOLN
4.0000 mg | Freq: Once | INTRAMUSCULAR | Status: AC
  Administered 2017-10-11: 4 mg via INTRAVENOUS
  Filled 2017-10-11: qty 2

## 2017-10-11 MED ORDER — SODIUM CHLORIDE 0.9 % IV BOLUS (SEPSIS)
1000.0000 mL | Freq: Once | INTRAVENOUS | Status: AC
  Administered 2017-10-11: 1000 mL via INTRAVENOUS

## 2017-10-11 MED ORDER — MORPHINE SULFATE (PF) 4 MG/ML IV SOLN
4.0000 mg | Freq: Once | INTRAVENOUS | Status: AC
  Administered 2017-10-11: 4 mg via INTRAVENOUS
  Filled 2017-10-11: qty 1

## 2017-10-11 MED ORDER — GI COCKTAIL ~~LOC~~
30.0000 mL | Freq: Once | ORAL | Status: AC
  Administered 2017-10-11: 30 mL via ORAL
  Filled 2017-10-11: qty 30

## 2017-10-11 MED ORDER — ONDANSETRON 4 MG PO TBDP: 4.0000 mg | ORAL_TABLET | Freq: Three times a day (TID) | ORAL | 0 refills | Status: DC | PRN

## 2017-10-11 MED ORDER — HYDROCODONE-ACETAMINOPHEN 5-325 MG PO TABS: 1.0000 | ORAL_TABLET | Freq: Four times a day (QID) | ORAL | 0 refills | Status: DC | PRN

## 2017-10-11 MED ORDER — PANTOPRAZOLE SODIUM 40 MG IV SOLR
40.0000 mg | Freq: Once | INTRAVENOUS | Status: AC
  Administered 2017-10-11: 40 mg via INTRAVENOUS
  Filled 2017-10-11: qty 40

## 2017-10-11 NOTE — ED Triage Notes (Signed)
Pt complains of being short of breath and right sided chest pain since yesterday Pt has hx of GERD and took all her medications for that with no relief

## 2017-10-11 NOTE — ED Triage Notes (Signed)
Patient c/o central chest pain and right upper abdominal pain since yesterday. Reports she was seen this morning for same and diagnosed with gallstones. Reports taking one dose of Vicodin without relief. Denies V/D.

## 2017-10-11 NOTE — ED Provider Notes (Signed)
[facility]-EMERGENCY DEPT Provider Note   CSN: [id number] Arrival date & time: 10/11/17  1412     History   Chief Complaint Chief Complaint  Patient presents with  . Chest Pain  . Abdominal Pain    HPI Tabitha Mcdonald is a 61 y.o. female.  HPI  61 year old female presents with continued upper abdominal pain and pleuritic chest pain.  She states that she has been having pain on and off like this for months, mostly 30 minutes after eating.  However over the last 2 days it has become worse.  She was seen in this ER in the middle the night and discharged around 6 in the morning.  She was diagnosed with gallstones and sent home with hydrocodone.  She states she went and took a nap but then when she woke up her pain was severe again.  She rates as a 9/10.  She took one hydrocodone but states it made her sick but did not help the pain.  She denies fevers, vomiting.  This morning she called the surgery office for an appointment and has an appointment scheduled in a couple days.  Past Medical History:  Diagnosis Date  . Arthritis   . Asthma    seasonal  . Back pain   . Cervical disc disorder with radiculopathy 01/21/2015  . GERD (gastroesophageal reflux disease)   . History of hiatal hernia    small hernia    Patient Active Problem List   Diagnosis Date Noted  . Chronic insomnia 05/18/2016  . Cervical disc disorder with radiculopathy 01/21/2015    Past Surgical History:  Procedure Laterality Date  . ABDOMINAL HYSTERECTOMY    . APPENDECTOMY    . BACK SURGERY    . CESAREAN SECTION    . DILATION AND CURETTAGE OF UTERUS    . TONSILLECTOMY      OB History    Gravida Para Term Preterm AB Living   2 2       1    SAB TAB Ectopic Multiple Live Births                   Home Medications    Prior to Admission medications   Medication Sig Start Date End Date Taking? Authorizing Provider  albuterol (PROVENTIL HFA;VENTOLIN HFA) 108 (90 Base) MCG/ACT inhaler  Inhale 2 puffs into the lungs every 6 (six) hours as needed for wheezing. 01/27/17  Yes Sunnie NielsenAlexander, Natalie, DO  Cholecalciferol (VITAMIN D3 PO) Take 1 capsule by mouth daily.   Yes [provider]  estradiol (VIVELLE-DOT) 0.025 MG/24HR Place 1 patch onto the skin 2 (two) times a week. 10/18/16  Yes Dove, Myra C, MD  fexofenadine (ALLEGRA) 180 MG tablet Take 180 mg by mouth daily.   Yes [provider]  HYDROcodone-acetaminophen (NORCO/VICODIN) 5-325 MG tablet Take 1-2 tablets by mouth every 6 (six) hours as needed. 10/11/17  Yes Ward, Kristen N, DO  ondansetron (ZOFRAN ODT) 4 MG disintegrating tablet Take 1 tablet (4 mg total) by mouth every 8 (eight) hours as needed for nausea or vomiting. 10/11/17  Yes Ward, Kristen N, DO  pantoprazole (PROTONIX) 40 MG tablet Take 40 mg by mouth daily. 07/08/17  Yes [provider]  zolpidem (AMBIEN) 10 MG tablet Take 0.5 tablets (5 mg total) by mouth at bedtime. Patient taking differently: Take 10 mg by mouth at bedtime.  10/18/16  Yes Allie Bossierove, Myra C, MD    Family History Family History  Problem Relation Age of  Onset  . Hypertension Father   . Diabetes Son     Social History Social History  Substance Use Topics  . Smoking status: Never Smoker  . Smokeless tobacco: Never Used  . Alcohol use No     Allergies   Ibuprofen   Review of Systems Review of Systems  Cardiovascular: Positive for chest pain.  Gastrointestinal: Positive for abdominal pain and diarrhea (2 episodes yesterday). Negative for constipation, nausea and vomiting.  All other systems reviewed and are negative.    Physical Exam Updated Vital Signs BP 132/80   Pulse 77   Temp 98.3 F (36.8 C) (Oral)   Resp 11   Ht 5\' 4"  (1.626 m)   Wt 79.4 kg (175 lb)   SpO2 100%   BMI 30.04 kg/m   Physical Exam  Constitutional: She is oriented to person, place, and time. She appears well-developed and well-nourished. No distress.  Resting comfortably  HENT:    Head: Normocephalic and atraumatic.  Right Ear: External ear normal.  Left Ear: External ear normal.  Nose: Nose normal.  Eyes: Right eye exhibits no discharge. Left eye exhibits no discharge.  Cardiovascular: Normal rate, regular rhythm and normal heart sounds.   Pulmonary/Chest: Effort normal and breath sounds normal.  Abdominal: Soft. There is tenderness in the right upper quadrant and epigastric area. There is negative Murphy's sign.  Neurological: She is alert and oriented to person, place, and time.  Skin: Skin is warm and dry. She is not diaphoretic.  Nursing note and vitals reviewed.    ED Treatments / Results  Labs (all labs ordered are listed, but only abnormal results are displayed) Labs Reviewed  CBC - Abnormal; Notable for the following:       Result Value   WBC 3.7 (*)    All other components within normal limits  HEPATIC FUNCTION PANEL - Abnormal; Notable for the following:    Alkaline Phosphatase 36 (*)    Bilirubin, Direct <0.1 (*)    All other components within normal limits  BASIC METABOLIC PANEL  LIPASE, BLOOD  I-STAT TROPONIN, ED    EKG  EKG Interpretation  Date/Time:  Tuesday October 11 2017 14:37:30 EDT Ventricular Rate:  72 PR Interval:    QRS Duration: 88 QT Interval:  402 QTC Calculation: 440 R Axis:   34 Text Interpretation:  Sinus rhythm Low voltage, precordial leads Baseline wander in lead(s) V1 V2 no significant change since earlier in the day Confirmed by Pricilla Loveless [phone]) on 10/11/2017 4:06:29 PM       Radiology Dg Chest 2 View  Result Date: 10/11/2017 CLINICAL DATA:  Shortness of breath and chest pain for 48 hours. History of reflux disease. EXAM: CHEST  2 VIEW COMPARISON:  Chest radiograph January 27, 2017 FINDINGS: Cardiomediastinal silhouette is normal. No pleural effusions or focal consolidations. Trachea projects midline and there is no pneumothorax. Soft tissue planes and included osseous structures are non-suspicious.  ACDF. IMPRESSION: No acute cardiopulmonary process. Electronically Signed   By: Awilda Metro M.D.   On: 10/11/2017 03:15   US Abdomen Limited Ruq  Result Date: 10/11/2017 CLINICAL DATA:  Right upper quadrant pain. Gallstone noted on outside ultrasound 2 months prior. EXAM: ULTRASOUND ABDOMEN LIMITED RIGHT UPPER QUADRANT COMPARISON:  None. FINDINGS: Gallbladder: Physiologically distended echogenic calculus with adjacent small stones or sludge. No gallbladder wall thickening or pericholecystic fluid. No sonographic Murphy sign noted by sonographer. Common bile duct: Diameter: 3 mm. Liver: No focal lesion identified. Increased and heterogeneous in parenchymal  echogenicity. Portal vein is patent on color Doppler imaging with normal direction of blood flow towards the liver. IMPRESSION: 1. Gallstone with adjacent small stones or sludge. No sonographic findings of acute cholecystitis. 2. Suspect mild hepatic steatosis. Electronically Signed   By: Rubye Oaks M.D.   On: 10/11/2017 05:36    Procedures Procedures (including critical care time)  Medications Ordered in ED Medications  morphine 4 MG/ML injection 4 mg (4 mg Intravenous Given 10/11/17 1657)  sodium chloride 0.9 % bolus 1,000 mL (0 mLs Intravenous Stopped 10/11/17 1802)     Initial Impression / Assessment and Plan / ED Course  I have reviewed the triage vital signs and the nursing notes.  Pertinent labs & imaging results that were available during my care of the patient were reviewed by me and considered in my medical decision making (see chart for details).     Patient continues to have right upper quadrant/upper abdominal pain and chest pain.  I think this is coming from her gallbladder.  However less than 24 hours ago she had a benign ultrasound besides stones.  Labs are stable.  LFTs benign.  At this point her I do not think this represents acute cholecystitis and do not think repeat imaging is needed.  She is afebrile and no  vomiting.  Pain is better after a dose of IV morphine.  Discussed options with patient, it has been recommended she try Zofran before hydrocodone because it made her sick.  However she has follow-up in 2 days with general surgery and I think this is reasonable for discharge.  Discussed strict return precautions.  He will given.    Final Clinical Impressions(s) / ED Diagnoses   Final diagnoses:  Calculus of gallbladder without cholecystitis without obstruction    New Prescriptions Discharge Medication List as of 10/11/2017  6:06 PM       Pricilla Loveless, MD 10/12/17 1610

## 2017-10-11 NOTE — ED Provider Notes (Signed)
TIME SEEN: 3:24 AM  CHIEF COMPLAINT: Chest pain, abdominal pain  HPI: Patient is a 61 year old female with history of asthma, GERD, hiatal hernia who presents to the emergency department with central and right-sided chest pain and abdominal pain that started on Sunday.  Pain has been constant and does not resolved.  States it does feel worse with deep inspiration.  She describes it as an achy feeling, tightness and also feeling full.  She denies fevers, cough.  States she has had shortness of breath especially with deep inspiration.  No nausea, vomiting, diaphoresis or dizziness.  States she has had a stress test years ago.  No history of cardiac catheterization.  She is status post C-section but no other abdominal surgery a smoker.  No history of hypertension, diabetes, hyperlipidemia.  Patient tried antacids at home without relief.  No other aggravating or alleviating factor.  ROS: See HPI Constitutional: no fever  Eyes: no drainage  ENT: no runny nose   Cardiovascular:   chest pain  Resp: SOB  GI: no vomiting GU: no dysuria Integumentary: no rash  Allergy: no hives  Musculoskeletal: no leg swelling  Neurological: no slurred speech ROS otherwise negative  PAST MEDICAL HISTORY/PAST SURGICAL HISTORY:  Past Medical History:  Diagnosis Date  . Arthritis   . Asthma    seasonal  . Back pain   . Cervical disc disorder with radiculopathy 01/21/2015  . GERD (gastroesophageal reflux disease)   . History of hiatal hernia    small hernia    MEDICATIONS:  Prior to Admission medications   Medication Sig Start Date End Date Taking? Authorizing Provider  albuterol (PROVENTIL HFA;VENTOLIN HFA) 108 (90 Base) MCG/ACT inhaler Inhale 2 puffs into the lungs every 6 (six) hours as needed for wheezing. 01/27/17   Sunnie Nielsen, DO  Benzocaine-Menthol 15-10 MG LOZG Use as directed 1 lozenge in the mouth or throat 4 (four) times daily as needed. Patient not taking: Reported on 09/20/2017 05/29/17    Lurene Shadow, PA-C  carbamide peroxide (DEBROX) 6.5 % OTIC solution Place 5 drops into both ears 2 (two) times daily. Patient not taking: Reported on 09/20/2017 05/29/17   Lurene Shadow, PA-C  Cholecalciferol (VITAMIN D3 PO) Take 1 capsule by mouth daily.    [provider]  estradiol (VIVELLE-DOT) 0.025 MG/24HR Place 1 patch onto the skin 2 (two) times a week. 10/18/16   Dove, Leanora Ivanoff, MD  guaiFENesin-codeine (ROBITUSSIN AC) 100-10 MG/5ML syrup Take 5-10 mLs by mouth 3 (three) times daily as needed for cough. Patient not taking: Reported on 09/20/2017 01/24/17   Sunnie Nielsen, DO  pantoprazole (PROTONIX) 40 MG tablet Take 40 mg by mouth daily. 07/08/17   [provider]  silver sulfADIAZINE (SILVADENE) 1 % cream Apply thin layer 1-2 times daily for 1 week Patient not taking: Reported on 09/20/2017 11/12/16   Lurene Shadow, PA-C  zolpidem (AMBIEN) 10 MG tablet Take 0.5 tablets (5 mg total) by mouth at bedtime. Patient taking differently: Take 10 mg by mouth at bedtime.  10/18/16   Allie Bossier, MD    ALLERGIES:  Allergies  Allergen Reactions  . Ibuprofen Nausea And Vomiting    SOCIAL HISTORY:  Social History  Substance Use Topics  . Smoking status: Never Smoker  . Smokeless tobacco: Never Used  . Alcohol use No    FAMILY HISTORY: Family History  Problem Relation Age of Onset  . Hypertension Father   . Diabetes Son     EXAM: BP Marland Kitchen)  145/97 (BP Location: Right Arm)   Pulse 81   Temp 97.7 F (36.5 C) (Oral)   Resp (!) 21   Ht 5\' 4"  (1.626 m)   Wt 79.4 kg (175 lb)   SpO2 99%   BMI 30.04 kg/m  CONSTITUTIONAL: Alert and oriented and responds appropriately to questions. Well-appearing; well-nourished HEAD: Normocephalic EYES: Conjunctivae clear, pupils appear equal, EOMI ENT: normal nose; moist mucous membranes NECK: Supple, no meningismus, no nuchal rigidity, no LAD  CARD: RRR; S1 and S2 appreciated; no murmurs, no clicks, no rubs, no gallops RESP: Normal  chest excursion without splinting or tachypnea; breath sounds clear and equal bilaterally; no wheezes, no rhonchi, no rales, no hypoxia or respiratory distress, speaking full sentences ABD/GI: Normal bowel sounds; non-distended; soft, patient is tender in the epigastric region or right upper quadrant with a negative Murphy sign, no rebound, no guarding, no peritoneal signs, no hepatosplenomegaly BACK:  The back appears normal and is non-tender to palpation, there is no CVA tenderness EXT: Normal ROM in all joints; non-tender to palpation; no edema; normal capillary refill; no cyanosis, no calf tenderness or swelling    SKIN: Normal color for age and race; warm; no rash NEURO: Moves all extremities equally PSYCH: The patient's mood and manner are appropriate. Grooming and personal hygiene are appropriate.  MEDICAL DECISION MAKING: Patient here with complaints of chest pain, shortness of breath and abdominal pain.  Differential diagnosis is large including PE, cholelithiasis/cholecystitis, pancreatitis, gastritis, GERD.  Low suspicion for ACS given patient does not have significant risk factors.  EKG shows no ischemic abnormality.  Will obtain labs, urine, right upper quadrant ultrasound.  Chest x-ray obtained in triage is unremarkable.  No sign of volume overload or pneumonia.  ED PROGRESS: Patient's labs are unremarkable.  No leukocytosis.  Normal creatinine, lipase, LFTs.  Negative troponin.  Normal d-dimer.  Ultrasound shows gallstones.  No sign of acute cholecystitis.  Patient still having some discomfort.  Will give GI cocktail and reassess.  Pain has improved with GI cocktail.  Urine shows no sign of infection, no ketones.  Able to drink fluids without difficulty.  Have offered her further pain medication which she declines.  I suspect that this is a component of biliary colic and GERD.  Will give outpatient surgical follow-up and discharged with Vicodin, Zofran.  She is already on Pepcid daily  which I have advised her to continue.  Recommended she avoid NSAIDs, spicy, acidic, greasy foods.  Recommend avoiding alcohol and coffee.  At this time, I do not feel there is any life-threatening condition present. I have reviewed and discussed all results (EKG, imaging, lab, urine as appropriate) and exam findings with patient/family. I have reviewed nursing notes and appropriate previous records.  I feel the patient is safe to be discharged home without further emergent workup and can continue workup as an outpatient as needed. Discussed usual and customary return precautions. Patient/family verbalize understanding and are comfortable with this plan.  Outpatient follow-up has been provided if needed. All questions have been answered.    EKG Interpretation  Date/Time:  Tuesday October 11 2017 02:27:43 EDT Ventricular Rate:  80 PR Interval:    QRS Duration: 65 QT Interval:  390 QTC Calculation: 450 R Axis:   28 Text Interpretation:  Sinus rhythm Low voltage, precordial leads Borderline T abnormalities, anterior leads Baseline wander in lead(s) V2 No significant change since last tracing Confirmed by Niyla Marone, Baxter Hire [phone]) on 10/11/2017 2:35:03 AM  Toyna Erisman, Layla MawKristen N, DO 10/11/17 [phone]

## 2017-10-11 NOTE — Discharge Instructions (Addendum)
Please avoid NSAIDs such as aspirin (Goody powders), ibuprofen (Motrin, Advil), naproxen (Aleve) as these may worsen your symptoms.  Tylenol 1000 mg every 6 hours is safe to take as long as you have no history of liver problems (heavy alcohol use, cirrhosis, hepatitis).  Please avoid spicy, acidic (citrus fruits, tomato based sauces, salsa), greasy, fatty foods.  Please avoid caffeine and alcohol.     Please continue your daily Pepcid.

## 2017-10-11 NOTE — ED Notes (Signed)
Pt tolerating sips of water well. 

## 2017-10-13 ENCOUNTER — Encounter (HOSPITAL_COMMUNITY): Payer: Self-pay | Admitting: *Deleted

## 2017-10-13 ENCOUNTER — Ambulatory Visit: Payer: Self-pay | Admitting: Surgery

## 2017-10-13 DIAGNOSIS — Z79891 Long term (current) use of opiate analgesic: Secondary | ICD-10-CM | POA: Diagnosis not present

## 2017-10-13 DIAGNOSIS — K8010 Calculus of gallbladder with chronic cholecystitis without obstruction: Secondary | ICD-10-CM | POA: Diagnosis not present

## 2017-10-13 DIAGNOSIS — Z79899 Other long term (current) drug therapy: Secondary | ICD-10-CM | POA: Diagnosis not present

## 2017-10-13 DIAGNOSIS — Z79890 Hormone replacement therapy (postmenopausal): Secondary | ICD-10-CM | POA: Diagnosis not present

## 2017-10-13 DIAGNOSIS — J45909 Unspecified asthma, uncomplicated: Secondary | ICD-10-CM | POA: Diagnosis not present

## 2017-10-13 DIAGNOSIS — K219 Gastro-esophageal reflux disease without esophagitis: Secondary | ICD-10-CM | POA: Diagnosis not present

## 2017-10-13 DIAGNOSIS — K8050 Calculus of bile duct without cholangitis or cholecystitis without obstruction: Secondary | ICD-10-CM | POA: Diagnosis present

## 2017-10-13 NOTE — H&P (View-Only) (Signed)
ADDALYNE VANDEHEI 10/13/2017 10:22 AM Location: [facility]  [facility] Patient #: [id number] DOB: 1956/04/29 Undefined / Language: [place] / Race: Black or African American Female  History of Present Illness (Onya Eutsler A. Fredricka Bonine MD; 10/13/2017 10:56 AM) Patient words: This is a very nice 61 year old woman who presents with biliary colic. She's been having intermittent postprandial epigastric and right upper quadrant pain for about 6 months. The last couple of weeks, she has had worse pain and more persistent pain. She's been to the emergency department twice within the last week due to pain. She does note some nausea with the pain but no fevers. Has not noticed any jaundice. In the emergency department 2 days ago, she underwent an ultrasound which showed gallstones without any signs of cholecystitis, common bile duct 3 mm, hepatic steatosis. Chest x-ray and EKG were unremarkable. Her LFTs were normal as well as her BMP and her CBC. She has been afraid to eat. She is tolerating water and small amounts of low-fat diet. Her symptoms have improved mildly with the Lortab and Zofran that she was given emergency department. She denies any heart disease but does have asthma she has a history of reflux and hiatal hernia as well as C-spine disorder for which she is scheduled to have surgery with Dr. Cecelia Byars on November 16. Surgical history notable for C-section and abdominal hysterectomy both via Pfannenstiel. Appendectomy is listed in her chart but she does not recall having any additional procedures.  The patient is a 61 year old female.   Past Surgical History Christianne Dolin, [place]; 10/13/2017 10:22 AM) Cesarean Section - 1 Colon Polyp Removal - Colonoscopy Hysterectomy (not due to cancer) - Complete Spinal Surgery - Neck Tonsillectomy  Diagnostic Studies History Christianne Dolin, RMA; 10/13/2017 10:22 AM) Colonoscopy within last year Mammogram within last year  Allergies  Christianne Dolin, RMA; 10/13/2017 10:23 AM) Zofran *ANTIEMETICS* migraine  Medication History Christianne Dolin, RMA; 10/13/2017 10:24 AM) Albuterol Sulfate HFA (108 (90 Base)MCG/ACT Aerosol Soln, Inhalation) Active. Estradiol (0.025MG /24HR , Transdermal) Active. Fexofenadine HCl (180MG  Tablet, Oral) Active. Hydrocodone-Acetaminophen (5-325MG  Tablet, Oral) Active. Pantoprazole Sodium (40MG  Tablet DR, Oral) Active. Vitamin D3 (Oral) Specific strength unknown - Active. Zolpidem Tartrate (10MG  Tablet, Oral) Active. Medications Reconciled  Social History Christianne Dolin, [place]; 10/13/2017 10:22 AM) Caffeine use Coffee. No alcohol use No drug use Tobacco use Never smoker.  Family History Christianne Dolin, [place]; 10/13/2017 10:22 AM) Diabetes Mellitus Son. Hypertension Father, Son.  Pregnancy / Birth History Christianne Dolin, [place]; 10/13/2017 10:22 AM) Age at menarche 16 years. Gravida 2 Maternal age 37-20 Para 2  Other Problems Christianne Dolin, [place]; 10/13/2017 10:22 AM) Cholelithiasis Gastroesophageal Reflux Disease     Review of Systems Christianne Dolin RMA; 10/13/2017 10:22 AM) General Present- Fatigue. Not Present- Appetite Loss, Chills, Fever, Night Sweats, Weight Gain and Weight Loss. Skin Not Present- Change in Wart/Mole, Dryness, Hives, Jaundice, New Lesions, Non-Healing Wounds, Rash and Ulcer. HEENT Present- Wears glasses/contact lenses. Not Present- Earache, Hearing Loss, Hoarseness, Nose Bleed, Oral Ulcers, Ringing in the Ears, Seasonal Allergies, Sinus Pain, Sore Throat, Visual Disturbances and Yellow Eyes. Respiratory Not Present- Bloody sputum, Chronic Cough, Difficulty Breathing, Snoring and Wheezing. Cardiovascular Not Present- Chest Pain, Difficulty Breathing Lying Down, Leg Cramps, Palpitations, Rapid Heart Rate, Shortness of Breath and Swelling of Extremities. Gastrointestinal Present- Abdominal Pain and Bloating. Not Present- Bloody Stool,  Change in Bowel Habits, Chronic diarrhea, Constipation, Difficulty Swallowing, Excessive gas, Gets full quickly at meals, Hemorrhoids, Indigestion, Nausea, Rectal Pain and Vomiting. Female Genitourinary Not Present-  Frequency, Nocturia, Painful Urination, Pelvic Pain and Urgency. Musculoskeletal Not Present- Back Pain, Joint Pain, Joint Stiffness, Muscle Pain, Muscle Weakness and Swelling of Extremities. Neurological Not Present- Decreased Memory, Fainting, Headaches, Numbness, Seizures, Tingling, Tremor, Trouble walking and Weakness. Psychiatric Not Present- Anxiety, Bipolar, Change in Sleep Pattern, Depression, Fearful and Frequent crying. Endocrine Not Present- Cold Intolerance, Excessive Hunger, Hair Changes, Heat Intolerance, Hot flashes and New Diabetes. Hematology Not Present- Blood Thinners, Easy Bruising, Excessive bleeding, Gland problems, HIV and Persistent Infections.  Vitals Christianne Dolin(Christen Lambert RMA; 10/13/2017 10:25 AM) 10/13/2017 10:24 AM Weight: 184.2 lb Height: 64in Body Surface Area: 1.89 m Body Mass Index: 31.62 kg/m  Temp.: 97.30F  Pulse: 132 (Regular)  BP: 120/82 (Sitting, Left Arm, Standard)      Physical Exam (Robbin Loughmiller A. Fredricka Bonineonnor MD; 10/13/2017 10:58 AM)  General Note: alert and well appearing  Integumentary Note: warm and dry  Head and Neck Note: no mass or thyromegaly  Eye Note: No scleral icterus. Extra ocular motions intact.  ENMT Note: Moist mucous membranes, dentition intact  Chest and Lung Exam Note: Unlabored respirations, clear bilaterally  Cardiovascular Note: Regular rate and rhythm, no pedal edema  Abdomen Note: Soft, obese, nondistended. She is tender in the epigastrium and right upper quadrant without rebound or guarding.  Neurologic Note: Grossly intact, normal gait  Neuropsychiatric Note: Normal mood and affect. Appropriate insight.  Musculoskeletal Note: Strength symmetrical throughout, no  deformity    Assessment & Plan (Samariya Rockhold A. Marieann Zipp MD; 10/13/2017 11:00 AM)  BILIARY COLIC (K80.50) Story: Symptoms for about 6 months but if condition in the last couple of weeks. She may have an element of cholecystitis seen on her ultrasound given the amount of pain that she is having. I recommend laparoscopic cholecystectomy. We discussed the technique of the procedure in detail and I discussed with her the risks of bleeding, infection, pain, scarring, conversion to open surgery, injury to intra-abdominal structures specifically the common bile duct and sequelae, and risk of failure to resolve symptoms. She expressed understanding. She had several insightful questions all of which were answered. We will try to get her scheduled in the next few days. We discussed signs and symptoms that should prompt her to return to the emergency room for more urgent intervention.

## 2017-10-13 NOTE — H&P (Signed)
Tabitha Mcdonald 10/13/2017 10:22 AM Location: [facility] Patient #: [id number] DOB: 02/21/1956 Undefined / Language: English / Race: Black or African American Female  History of Present Illness (Katalena Malveaux A. Beonka Amesquita MD; 10/13/2017 10:56 AM) Patient words: This is a very nice 61-year-old woman who presents with biliary colic. She's been having intermittent postprandial epigastric and right upper quadrant pain for about 6 months. The last couple of weeks, she has had worse pain and more persistent pain. She's been to the emergency department twice within the last week due to pain. She does note some nausea with the pain but no fevers. Has not noticed any jaundice. In the emergency department 2 days ago, she underwent an ultrasound which showed gallstones without any signs of cholecystitis, common bile duct 3 mm, hepatic steatosis. Chest x-ray and EKG were unremarkable. Her LFTs were normal as well as her BMP and her CBC. She has been afraid to eat. She is tolerating water and small amounts of low-fat diet. Her symptoms have improved mildly with the Lortab and Zofran that she was given emergency department. She denies any heart disease but does have asthma she has a history of reflux and hiatal hernia as well as C-spine disorder for which she is scheduled to have surgery with Dr. Nguyen Kumar on November 16. Surgical history notable for C-section and abdominal hysterectomy both via Pfannenstiel. Appendectomy is listed in her chart but she does not recall having any additional procedures.  The patient is a 61 year old female.   Past Surgical History (Christen Lambert, RMA; 10/13/2017 10:22 AM) Cesarean Section - 1 Colon Polyp Removal - Colonoscopy Hysterectomy (not due to cancer) - Complete Spinal Surgery - Neck Tonsillectomy  Diagnostic Studies History (Christen Lambert, RMA; 10/13/2017 10:22 AM) Colonoscopy within last year Mammogram within last year  Allergies  (Christen Lambert, RMA; 10/13/2017 10:23 AM) Zofran *ANTIEMETICS* migraine  Medication History (Christen Lambert, RMA; 10/13/2017 10:24 AM) Albuterol Sulfate HFA (108 (90 Base)MCG/ACT Aerosol Soln, Inhalation) Active. Estradiol (0.025MG/24HR , Transdermal) Active. Fexofenadine HCl (180MG Tablet, Oral) Active. Hydrocodone-Acetaminophen (5-325MG Tablet, Oral) Active. Pantoprazole Sodium (40MG Tablet DR, Oral) Active. Vitamin D3 (Oral) Specific strength unknown - Active. Zolpidem Tartrate (10MG Tablet, Oral) Active. Medications Reconciled  Social History (Christen Lambert, RMA; 10/13/2017 10:22 AM) Caffeine use Coffee. No alcohol use No drug use Tobacco use Never smoker.  Family History (Christen Lambert, RMA; 10/13/2017 10:22 AM) Diabetes Mellitus Son. Hypertension Father, Son.  Pregnancy / Birth History (Christen Lambert, RMA; 10/13/2017 10:22 AM) Age at menarche 16 years. Gravida 2 Maternal age 15-20 Para 2  Other Problems (Christen Lambert, RMA; 10/13/2017 10:22 AM) Cholelithiasis Gastroesophageal Reflux Disease     Review of Systems (Christen Lambert RMA; 10/13/2017 10:22 AM) General Present- Fatigue. Not Present- Appetite Loss, Chills, Fever, Night Sweats, Weight Gain and Weight Loss. Skin Not Present- Change in Wart/Mole, Dryness, Hives, Jaundice, New Lesions, Non-Healing Wounds, Rash and Ulcer. HEENT Present- Wears glasses/contact lenses. Not Present- Earache, Hearing Loss, Hoarseness, Nose Bleed, Oral Ulcers, Ringing in the Ears, Seasonal Allergies, Sinus Pain, Sore Throat, Visual Disturbances and Yellow Eyes. Respiratory Not Present- Bloody sputum, Chronic Cough, Difficulty Breathing, Snoring and Wheezing. Cardiovascular Not Present- Chest Pain, Difficulty Breathing Lying Down, Leg Cramps, Palpitations, Rapid Heart Rate, Shortness of Breath and Swelling of Extremities. Gastrointestinal Present- Abdominal Pain and Bloating. Not Present- Bloody Stool,  Change in Bowel Habits, Chronic diarrhea, Constipation, Difficulty Swallowing, Excessive gas, Gets full quickly at meals, Hemorrhoids, Indigestion, Nausea, Rectal Pain and Vomiting. Female Genitourinary Not Present-   Frequency, Nocturia, Painful Urination, Pelvic Pain and Urgency. Musculoskeletal Not Present- Back Pain, Joint Pain, Joint Stiffness, Muscle Pain, Muscle Weakness and Swelling of Extremities. Neurological Not Present- Decreased Memory, Fainting, Headaches, Numbness, Seizures, Tingling, Tremor, Trouble walking and Weakness. Psychiatric Not Present- Anxiety, Bipolar, Change in Sleep Pattern, Depression, Fearful and Frequent crying. Endocrine Not Present- Cold Intolerance, Excessive Hunger, Hair Changes, Heat Intolerance, Hot flashes and New Diabetes. Hematology Not Present- Blood Thinners, Easy Bruising, Excessive bleeding, Gland problems, HIV and Persistent Infections.  Vitals (Christen Lambert RMA; 10/13/2017 10:25 AM) 10/13/2017 10:24 AM Weight: 184.2 lb Height: 64in Body Surface Area: 1.89 m Body Mass Index: 31.62 kg/m  Temp.: 97.2F  Pulse: 132 (Regular)  BP: 120/82 (Sitting, Left Arm, Standard)      Physical Exam (Shaterica Mcclatchy A. Ashdon Gillson MD; 10/13/2017 10:58 AM)  General Note: alert and well appearing  Integumentary Note: warm and dry  Head and Neck Note: no mass or thyromegaly  Eye Note: No scleral icterus. Extra ocular motions intact.  ENMT Note: Moist mucous membranes, dentition intact  Chest and Lung Exam Note: Unlabored respirations, clear bilaterally  Cardiovascular Note: Regular rate and rhythm, no pedal edema  Abdomen Note: Soft, obese, nondistended. She is tender in the epigastrium and right upper quadrant without rebound or guarding.  Neurologic Note: Grossly intact, normal gait  Neuropsychiatric Note: Normal mood and affect. Appropriate insight.  Musculoskeletal Note: Strength symmetrical throughout, no  deformity    Assessment & Plan (Taralynn Quiett A. Yurani Fettes MD; 10/13/2017 11:00 AM)  BILIARY COLIC (K80.50) Story: Symptoms for about 6 months but if condition in the last couple of weeks. She may have an element of cholecystitis seen on her ultrasound given the amount of pain that she is having. I recommend laparoscopic cholecystectomy. We discussed the technique of the procedure in detail and I discussed with her the risks of bleeding, infection, pain, scarring, conversion to open surgery, injury to intra-abdominal structures specifically the common bile duct and sequelae, and risk of failure to resolve symptoms. She expressed understanding. She had several insightful questions all of which were answered. We will try to get her scheduled in the next few days. We discussed signs and symptoms that should prompt her to return to the emergency room for more urgent intervention. 

## 2017-10-18 ENCOUNTER — Other Ambulatory Visit: Payer: Self-pay

## 2017-10-18 ENCOUNTER — Ambulatory Visit (HOSPITAL_COMMUNITY): Payer: BLUE CROSS/BLUE SHIELD | Admitting: Registered Nurse

## 2017-10-18 ENCOUNTER — Encounter (HOSPITAL_COMMUNITY)
Admission: RE | Disposition: A | Discharge status: Home / Self Care | Payer: Self-pay | Source: Ambulatory Visit | Attending: Surgery

## 2017-10-18 ENCOUNTER — Encounter (HOSPITAL_COMMUNITY): Payer: Self-pay | Admitting: *Deleted

## 2017-10-18 ENCOUNTER — Ambulatory Visit (HOSPITAL_COMMUNITY)
Admission: RE | Admit: 2017-10-18 | Discharge: 2017-10-18 | Disposition: A | Discharge status: Home / Self Care | Payer: BLUE CROSS/BLUE SHIELD | Source: Ambulatory Visit | Attending: Surgery | Admitting: Surgery

## 2017-10-18 DIAGNOSIS — K219 Gastro-esophageal reflux disease without esophagitis: Secondary | ICD-10-CM | POA: Insufficient documentation

## 2017-10-18 DIAGNOSIS — Z79890 Hormone replacement therapy (postmenopausal): Secondary | ICD-10-CM | POA: Insufficient documentation

## 2017-10-18 DIAGNOSIS — J45909 Unspecified asthma, uncomplicated: Secondary | ICD-10-CM | POA: Insufficient documentation

## 2017-10-18 DIAGNOSIS — Z79899 Other long term (current) drug therapy: Secondary | ICD-10-CM | POA: Insufficient documentation

## 2017-10-18 DIAGNOSIS — K8010 Calculus of gallbladder with chronic cholecystitis without obstruction: Secondary | ICD-10-CM | POA: Diagnosis not present

## 2017-10-18 DIAGNOSIS — Z79891 Long term (current) use of opiate analgesic: Secondary | ICD-10-CM | POA: Insufficient documentation

## 2017-10-18 HISTORY — DX: Other complications of anesthesia, initial encounter: T88.59XA

## 2017-10-18 HISTORY — PX: CHOLECYSTECTOMY: SHX55

## 2017-10-18 HISTORY — DX: Adverse effect of unspecified anesthetic, initial encounter: T41.45XA

## 2017-10-18 SURGERY — LAPAROSCOPIC CHOLECYSTECTOMY
Anesthesia: General | Site: Abdomen

## 2017-10-18 MED ORDER — CELECOXIB 200 MG PO CAPS
200.0000 mg | ORAL_CAPSULE | ORAL | Status: AC
  Administered 2017-10-18: 200 mg via ORAL
  Filled 2017-10-18: qty 1

## 2017-10-18 MED ORDER — LACTATED RINGERS IV SOLN
INTRAVENOUS | Status: AC | PRN
  Administered 2017-10-18: 1000 mL

## 2017-10-18 MED ORDER — GABAPENTIN 300 MG PO CAPS
300.0000 mg | ORAL_CAPSULE | ORAL | Status: AC
  Administered 2017-10-18: 300 mg via ORAL
  Filled 2017-10-18: qty 1

## 2017-10-18 MED ORDER — OXYCODONE-ACETAMINOPHEN 5-325 MG PO TABS: 1.0000 | ORAL_TABLET | Freq: Four times a day (QID) | ORAL | 0 refills | Status: DC | PRN

## 2017-10-18 MED ORDER — PROMETHAZINE HCL 25 MG/ML IJ SOLN: 6.2500 mg | INTRAMUSCULAR | Status: DC | PRN

## 2017-10-18 MED ORDER — ROCURONIUM BROMIDE 50 MG/5ML IV SOSY
PREFILLED_SYRINGE | INTRAVENOUS | Status: AC
  Filled 2017-10-18: qty 5

## 2017-10-18 MED ORDER — METOCLOPRAMIDE HCL 5 MG/ML IJ SOLN
INTRAMUSCULAR | Status: DC | PRN
  Administered 2017-10-18: 10 mg via INTRAVENOUS

## 2017-10-18 MED ORDER — FENTANYL CITRATE (PF) 250 MCG/5ML IJ SOLN
INTRAMUSCULAR | Status: DC | PRN
  Administered 2017-10-18: 100 ug via INTRAVENOUS

## 2017-10-18 MED ORDER — ACETAMINOPHEN 500 MG PO TABS
1000.0000 mg | ORAL_TABLET | ORAL | Status: AC
  Administered 2017-10-18: 1000 mg via ORAL
  Filled 2017-10-18: qty 2

## 2017-10-18 MED ORDER — LIDOCAINE 2% (20 MG/ML) 5 ML SYRINGE
INTRAMUSCULAR | Status: DC | PRN
  Administered 2017-10-18: 100 mg via INTRAVENOUS

## 2017-10-18 MED ORDER — HYDROMORPHONE HCL 1 MG/ML IJ SOLN
0.2500 mg | INTRAMUSCULAR | Status: DC | PRN
  Administered 2017-10-18 (×2): 0.25 mg via INTRAVENOUS

## 2017-10-18 MED ORDER — CHLORHEXIDINE GLUCONATE 4 % EX LIQD: 60.0000 mL | Freq: Once | CUTANEOUS | Status: DC

## 2017-10-18 MED ORDER — PHENYLEPHRINE 40 MCG/ML (10ML) SYRINGE FOR IV PUSH (FOR BLOOD PRESSURE SUPPORT)
PREFILLED_SYRINGE | INTRAVENOUS | Status: AC
  Filled 2017-10-18: qty 10

## 2017-10-18 MED ORDER — MIDAZOLAM HCL 2 MG/2ML IJ SOLN
INTRAMUSCULAR | Status: AC
  Filled 2017-10-18: qty 2

## 2017-10-18 MED ORDER — SUGAMMADEX SODIUM 500 MG/5ML IV SOLN
INTRAVENOUS | Status: AC
  Filled 2017-10-18: qty 5

## 2017-10-18 MED ORDER — DEXAMETHASONE SODIUM PHOSPHATE 10 MG/ML IJ SOLN
INTRAMUSCULAR | Status: DC | PRN
  Administered 2017-10-18: 10 mg via INTRAVENOUS

## 2017-10-18 MED ORDER — FENTANYL CITRATE (PF) 250 MCG/5ML IJ SOLN
INTRAMUSCULAR | Status: AC
  Filled 2017-10-18: qty 5

## 2017-10-18 MED ORDER — LACTATED RINGERS IV SOLN
INTRAVENOUS | Status: DC | PRN
  Administered 2017-10-18 (×2): via INTRAVENOUS

## 2017-10-18 MED ORDER — BUPIVACAINE-EPINEPHRINE (PF) 0.25% -1:200000 IJ SOLN
INTRAMUSCULAR | Status: AC
  Filled 2017-10-18: qty 30

## 2017-10-18 MED ORDER — SCOPOLAMINE 1 MG/3DAYS TD PT72
MEDICATED_PATCH | TRANSDERMAL | Status: DC | PRN
  Administered 2017-10-18: 1 via TRANSDERMAL

## 2017-10-18 MED ORDER — CEFAZOLIN SODIUM-DEXTROSE 2-4 GM/100ML-% IV SOLN
INTRAVENOUS | Status: AC
  Filled 2017-10-18: qty 100

## 2017-10-18 MED ORDER — EPHEDRINE 5 MG/ML INJ
INTRAVENOUS | Status: AC
  Filled 2017-10-18: qty 10

## 2017-10-18 MED ORDER — SCOPOLAMINE 1 MG/3DAYS TD PT72
MEDICATED_PATCH | TRANSDERMAL | Status: AC
  Filled 2017-10-18: qty 1

## 2017-10-18 MED ORDER — PROPOFOL 10 MG/ML IV BOLUS
INTRAVENOUS | Status: AC
  Filled 2017-10-18: qty 20

## 2017-10-18 MED ORDER — CEFAZOLIN SODIUM-DEXTROSE 2-4 GM/100ML-% IV SOLN
2.0000 g | INTRAVENOUS | Status: AC
  Administered 2017-10-18: 2 g via INTRAVENOUS

## 2017-10-18 MED ORDER — BUPIVACAINE-EPINEPHRINE 0.25% -1:200000 IJ SOLN
INTRAMUSCULAR | Status: DC | PRN
  Administered 2017-10-18: 30 mL

## 2017-10-18 MED ORDER — DEXAMETHASONE SODIUM PHOSPHATE 10 MG/ML IJ SOLN
INTRAMUSCULAR | Status: AC
  Filled 2017-10-18: qty 1

## 2017-10-18 MED ORDER — OXYCODONE HCL 5 MG PO TABS
5.0000 mg | ORAL_TABLET | ORAL | Status: DC | PRN
  Administered 2017-10-18: 5 mg via ORAL
  Filled 2017-10-18: qty 1

## 2017-10-18 MED ORDER — DOCUSATE SODIUM 100 MG PO CAPS: 100.0000 mg | ORAL_CAPSULE | Freq: Two times a day (BID) | ORAL | 0 refills | Status: AC

## 2017-10-18 MED ORDER — ROCURONIUM BROMIDE 10 MG/ML (PF) SYRINGE
PREFILLED_SYRINGE | INTRAVENOUS | Status: DC | PRN
  Administered 2017-10-18: 50 mg via INTRAVENOUS

## 2017-10-18 MED ORDER — LIDOCAINE 2% (20 MG/ML) 5 ML SYRINGE
INTRAMUSCULAR | Status: AC
  Filled 2017-10-18: qty 5

## 2017-10-18 MED ORDER — KETOROLAC TROMETHAMINE 30 MG/ML IJ SOLN: 30.0000 mg | Freq: Once | INTRAMUSCULAR | Status: DC | PRN

## 2017-10-18 MED ORDER — SUGAMMADEX SODIUM 200 MG/2ML IV SOLN
INTRAVENOUS | Status: DC | PRN
  Administered 2017-10-18: 400 mg via INTRAVENOUS

## 2017-10-18 MED ORDER — PHENYLEPHRINE 40 MCG/ML (10ML) SYRINGE FOR IV PUSH (FOR BLOOD PRESSURE SUPPORT)
PREFILLED_SYRINGE | INTRAVENOUS | Status: DC | PRN
  Administered 2017-10-18: 120 ug via INTRAVENOUS
  Administered 2017-10-18 (×2): 80 ug via INTRAVENOUS

## 2017-10-18 MED ORDER — ONDANSETRON HCL 4 MG/2ML IJ SOLN
INTRAMUSCULAR | Status: AC
  Filled 2017-10-18: qty 2

## 2017-10-18 MED ORDER — EPHEDRINE SULFATE-NACL 50-0.9 MG/10ML-% IV SOSY
PREFILLED_SYRINGE | INTRAVENOUS | Status: DC | PRN
  Administered 2017-10-18: 10 mg via INTRAVENOUS

## 2017-10-18 MED ORDER — PROMETHAZINE HCL 25 MG/ML IJ SOLN
INTRAMUSCULAR | Status: AC
  Filled 2017-10-18: qty 1

## 2017-10-18 MED ORDER — HYDROMORPHONE HCL 1 MG/ML IJ SOLN
INTRAMUSCULAR | Status: AC
  Administered 2017-10-18: 0.25 mg via INTRAVENOUS
  Filled 2017-10-18: qty 1

## 2017-10-18 MED ORDER — MIDAZOLAM HCL 5 MG/5ML IJ SOLN
INTRAMUSCULAR | Status: DC | PRN
  Administered 2017-10-18: 2 mg via INTRAVENOUS

## 2017-10-18 MED ORDER — PROPOFOL 10 MG/ML IV BOLUS
INTRAVENOUS | Status: DC | PRN
  Administered 2017-10-18: 150 mg via INTRAVENOUS

## 2017-10-18 MED ORDER — METOCLOPRAMIDE HCL 5 MG/ML IJ SOLN
INTRAMUSCULAR | Status: AC
  Filled 2017-10-18: qty 2

## 2017-10-18 SURGICAL SUPPLY — 32 items
APPLIER CLIP ROT 10 11.4 M/L (STAPLE) ×2
CABLE HIGH FREQUENCY MONO STRZ (ELECTRODE) ×2 IMPLANT
CHLORAPREP W/TINT 26ML (MISCELLANEOUS) ×2 IMPLANT
CLIP APPLIE ROT 10 11.4 M/L (STAPLE) ×1 IMPLANT
COVER MAYO STAND STRL (DRAPES) IMPLANT
COVER SURGICAL LIGHT HANDLE (MISCELLANEOUS) ×2 IMPLANT
DECANTER SPIKE VIAL GLASS SM (MISCELLANEOUS) ×2 IMPLANT
DERMABOND ADVANCED (GAUZE/BANDAGES/DRESSINGS) ×1
DERMABOND ADVANCED .7 DNX12 (GAUZE/BANDAGES/DRESSINGS) ×1 IMPLANT
DEVICE PMI PUNCTURE CLOSURE (MISCELLANEOUS) ×2 IMPLANT
DRAPE C-ARM 42X120 X-RAY (DRAPES) IMPLANT
ELECT REM PT RETURN 15FT ADLT (MISCELLANEOUS) ×2 IMPLANT
GLOVE BIO SURGEON STRL SZ 6 (GLOVE) ×2 IMPLANT
GLOVE INDICATOR 6.5 STRL GRN (GLOVE) ×2 IMPLANT
GOWN STRL REUS W/TWL LRG LVL3 (GOWN DISPOSABLE) ×2 IMPLANT
GOWN STRL REUS W/TWL XL LVL3 (GOWN DISPOSABLE) ×4 IMPLANT
GRASPER SUT TROCAR 14GX15 (MISCELLANEOUS) ×2 IMPLANT
HEMOSTAT SNOW SURGICEL 2X4 (HEMOSTASIS) IMPLANT
KIT BASIN OR (CUSTOM PROCEDURE TRAY) ×2 IMPLANT
NEEDLE INSUFFLATION 14GA 120MM (NEEDLE) ×2 IMPLANT
POUCH SPECIMEN RETRIEVAL 10MM (ENDOMECHANICALS) ×2 IMPLANT
SCISSORS LAP 5X35 DISP (ENDOMECHANICALS) ×2 IMPLANT
SET CHOLANGIOGRAPH MIX (MISCELLANEOUS) IMPLANT
SET IRRIG TUBING LAPAROSCOPIC (IRRIGATION / IRRIGATOR) ×2 IMPLANT
SLEEVE XCEL OPT CAN 5 100 (ENDOMECHANICALS) ×4 IMPLANT
SUT MNCRL AB 4-0 PS2 18 (SUTURE) ×2 IMPLANT
TOWEL OR 17X26 10 PK STRL BLUE (TOWEL DISPOSABLE) ×2 IMPLANT
TOWEL OR NON WOVEN STRL DISP B (DISPOSABLE) IMPLANT
TRAY LAPAROSCOPIC (CUSTOM PROCEDURE TRAY) ×2 IMPLANT
TROCAR BLADELESS OPT 5 100 (ENDOMECHANICALS) ×2 IMPLANT
TROCAR XCEL 12X100 BLDLESS (ENDOMECHANICALS) ×2 IMPLANT
TUBING INSUF HEATED (TUBING) ×2 IMPLANT

## 2017-10-18 NOTE — Anesthesia Preprocedure Evaluation (Signed)
Anesthesia Evaluation  Patient identified by MRN, date of birth, ID band Patient awake    Reviewed: Allergy & Precautions, NPO status , Patient's Chart, lab work & pertinent test results  Airway Mallampati: II  TM Distance: >3 FB Neck ROM: Limited    Dental no notable dental hx.    Pulmonary neg pulmonary ROS,    Pulmonary exam normal breath sounds clear to auscultation       Cardiovascular negative cardio ROS Normal cardiovascular exam Rhythm:Regular Rate:Normal     Neuro/Psych negative neurological ROS  negative psych ROS   GI/Hepatic negative GI ROS, Neg liver ROS, GERD  ,  Endo/Other  negative endocrine ROS  Renal/GU negative Renal ROS  negative genitourinary   Musculoskeletal negative musculoskeletal ROS (+)   Abdominal   Peds negative pediatric ROS (+)  Hematology negative hematology ROS (+)   Anesthesia Other Findings   Reproductive/Obstetrics negative OB ROS                             Anesthesia Physical Anesthesia Plan  ASA: II  Anesthesia Plan: General   Post-op Pain Management:    Induction: Intravenous  PONV Risk Score and Plan: 3  Airway Management Planned: Oral ETT  Additional Equipment:   Intra-op Plan:   Post-operative Plan: Extubation in OR  Informed Consent: I have reviewed the patients History and Physical, chart, labs and discussed the procedure including the risks, benefits and alternatives for the proposed anesthesia with the patient or authorized representative who has indicated his/her understanding and acceptance.   Dental advisory given  Plan Discussed with: CRNA and Surgeon  Anesthesia Plan Comments:         Anesthesia Quick Evaluation

## 2017-10-18 NOTE — Op Note (Signed)
Operative Note  Tabitha AblesMichele A Mcdonald 61 y.o. female [id number]  10/18/2017  Surgeon: Berna Buehelsea A Taleshia Luff MD  Assistant: OR staff  Procedure performed: Laparoscopic Cholecystectomy  Preop diagnosis: biliary colic Post-op diagnosis/intraop findings: same  Specimens: gallbladder  EBL: minimal  Complications: none  Description of procedure: After obtaining informed consent the patient was brought to the operating room. Prophylactic antibiotics and subcutaneous heparin were administered. SCD's were applied. General endotracheal anesthesia was initiated and a formal time-out was performed. The abdomen was prepped and draped in the usual sterile fashion and the abdomen was entered using an infraumbilical veress needle after instilling the site with local. Insufflation to 15mmHg was obtained, 5mm trocar and camera introduced and gross inspection revealed no evidence of injury from our entry or other intraabdominal abnormalities. Two 5mm trocars were introduced in the right midclavicular and right anterior axillary lines under direct visualization and following infiltration with local. An 11mm trocar was placed in the epigastrium to the patient's right of the falciform. The gallbladder was retracted cephalad and the infundibulum was retracted laterally. A combination of hook electrocautery and blunt dissection was utilized to clear the peritoneum from the neck and cystic duct, circumferentially isolating the cystic artery and cystic duct and lifting the gallbladder from the cystic plate. The critical view of safety was achieved with the cystic artery, cystic duct, and liver bed visualized between them with no other structures. The artery was clipped with a single clip proximally and distally and divided as was the cystic duct with three clips on the proximal end. The gallbladder was dissected from the liver plate using electrocautery. Once freed the gallbladder was placed in an endocatch bag and removed intact  through the epigastric trocar site. Hemostasis was once again confirmed, and reinspection of the abdomen revealed no injuries. The clips were well opposed without any bile leak from the duct or the liver bed. The 11mm trocar site in the epigastrium was closed with a 0 vicryl in the fascia under direct visualization using a PMI device. The abdomen was desufflated and all trocars removed. The skin incisions were closed with running subcuticular monocryl and Dermabond. The patient was awakened, extubated and transported to the recovery room in stable condition.   All counts were correct at the completion of the case.

## 2017-10-18 NOTE — Interval H&P Note (Signed)
History and Physical Interval Note:  10/18/2017 7:10 AM  Tabitha Mcdonald  has presented today for surgery, with the diagnosis of BILIARY COLIC CHOLECYSTITIS   The various methods of treatment have been discussed with the patient and family. After consideration of risks, benefits and other options for treatment, the patient has consented to  Procedure(s): LAPAROSCOPIC CHOLECYSTECTOMY (N/A) as a surgical intervention .  The patient's history has been reviewed, patient examined, no change in status, stable for surgery.  I have reviewed the patient's chart and labs.  Questions were answered to the patient's satisfaction.     Tabitha Mcdonald

## 2017-10-18 NOTE — Discharge Instructions (Signed)
General Anesthesia, Adult, Care After These instructions provide you with information about caring for yourself after your procedure. Your health care provider may also give you more specific instructions. Your treatment has been planned according to current medical practices, but problems sometimes occur. Call your health care provider if you have any problems or questions after your procedure. What can I expect after the procedure? After the procedure, it is common to have:  Vomiting.  A sore throat.  Mental slowness.  It is common to feel:  Nauseous.  Cold or shivery.  Sleepy.  Tired.  Sore or achy, even in parts of your body where you did not have surgery.  Follow these instructions at home: For at least 24 hours after the procedure:  Do not: ? Participate in activities where you could fall or become injured. ? Drive. ? Use heavy machinery. ? Drink alcohol. ? Take sleeping pills or medicines that cause drowsiness. ? Make important decisions or sign legal documents. ? Take care of children on your own.  Rest. Eating and drinking  If you vomit, drink water, juice, or soup when you can drink without vomiting.  Drink enough fluid to keep your urine clear or pale yellow.  Make sure you have little or no nausea before eating solid foods.  Follow the diet recommended by your health care provider. General instructions  Have a responsible adult stay with you until you are awake and alert.  Return to your normal activities as told by your health care provider. Ask your health care provider what activities are safe for you.  Take over-the-counter and prescription medicines only as told by your health care provider.  If you smoke, do not smoke without supervision.  Keep all follow-up visits as told by your health care provider. This is important. Contact a health care provider if:  You continue to have nausea or vomiting at home, and medicines are not helpful.  You  cannot drink fluids or start eating again.  You cannot urinate after 8-12 hours.  You develop a skin rash.  You have fever.  You have increasing redness at the site of your procedure. Get help right away if:  You have difficulty breathing.  You have chest pain.  You have unexpected bleeding.  You feel that you are having a life-threatening or urgent problem. This information is not intended to replace advice given to you by your health care provider. Make sure you discuss any questions you have with your health care provider. Document Released: 03/07/2001 Document Revised: 05/03/2016 Document Reviewed: 11/13/2015 Elsevier Interactive Patient Education  2018 [place]: POST OP INSTRUCTIONS  ######################################################################  EAT Gradually transition to a high fiber diet with a fiber supplement over the next few weeks after discharge.  Start with a pureed / full liquid diet (see below)  WALK Walk an hour a day.  Control your pain to do that.    CONTROL PAIN Control pain so that you can walk, sleep, tolerate sneezing/coughing, go up/down stairs.  HAVE A BOWEL MOVEMENT DAILY Keep your bowels regular to avoid problems.  OK to try a laxative to override constipation.  OK to use an antidairrheal to slow down diarrhea.  Call if not better after 2 tries  CALL IF YOU HAVE PROBLEMS/CONCERNS Call if you are still struggling despite following these instructions. Call if you have concerns not answered by these instructions  ######################################################################    1. DIET: Follow a light bland diet the first 24 hours after arrival  home, such as soup, liquids, crackers, etc.  Be sure to include lots of fluids daily.  Avoid fast food or heavy meals as your are more likely to get nauseated.  Eat a low fat the next few days after surgery.   2. Take your usually prescribed home medications  unless otherwise directed. 3. PAIN CONTROL: a. Pain is best controlled by a usual combination of three different methods TOGETHER: i. Ice/Heat ii. Over the counter pain medication iii. Prescription pain medication b. Most patients will experience some swelling and bruising around the incisions.  Ice packs or heating pads (30-60 minutes up to 6 times a day) will help. Use ice for the first few days to help decrease swelling and bruising, then switch to heat to help relax tight/sore spots and speed recovery.  Some people prefer to use ice alone, heat alone, alternating between ice & heat.  Experiment to what works for you.  Swelling and bruising can take several weeks to resolve.   c. It is helpful to take an over-the-counter pain medication regularly for the first few weeks.  Choose one of the following that works best for you: i. Naproxen (Aleve, etc)  Two 220mg  tabs twice a day ii. Ibuprofen (Advil, etc) Three 200mg  tabs four times a day (every meal & bedtime) iii. Acetaminophen (Tylenol, etc) 500-650mg  four times a day (every meal & bedtime) d. A  prescription for pain medication (such as oxycodone, hydrocodone, etc) should be given to you upon discharge.  Take your pain medication as prescribed.  i. If you are having problems/concerns with the prescription medicine (does not control pain, nausea, vomiting, rash, itching, etc), please call us [phone] to see if we need to switch you to a different pain medicine that will work better for you and/or control your side effect better. ii. If you need a refill on your pain medication, please contact your pharmacy.  They will contact our office to request authorization. Prescriptions will not be filled after 5 pm or on week-ends. 4. Avoid getting constipated.  Between the surgery and the pain medications, it is common to experience some constipation.  Increasing fluid intake and taking a fiber supplement (such as Metamucil, Citrucel, FiberCon,  MiraLax, etc) 1-2 times a day regularly will usually help prevent this problem from occurring.  A mild laxative (prune juice, Milk of Magnesia, MiraLax, etc) should be taken according to package directions if there are no bowel movements after 48 hours.   5. Watch out for diarrhea.  If you have many loose bowel movements, simplify your diet to bland foods & liquids for a few days.  Stop any stool softeners and decrease your fiber supplement.  Switching to mild anti-diarrheal medications (Kayopectate, Pepto Bismol) can help.  If this worsens or does not improve, please call us. 6. Wash / shower every day.  You may shower over the skin glue which is waterproof.  Continue to shower over incision(s) after the dressing is off. 7. Skin glue will flake off after about 2 weeks.  You may leave the incision open to air.  You may replace a dressing/Band-Aid to cover the incision for comfort if you wish.  8. ACTIVITIES as tolerated:   a. You may resume regular (light) daily activities beginning the next day--such as daily self-care, walking, climbing stairs--gradually increasing activities as tolerated.  If you can walk 30 minutes without difficulty, it is safe to try more intense activity such as jogging, treadmill, bicycling, low-impact aerobics, swimming, etc. b.  Save the most intensive and strenuous activity for last such as sit-ups, heavy lifting, contact sports, etc  Refrain from any heavy lifting or straining until you are off narcotics for pain control.   c. DO NOT PUSH THROUGH PAIN.  Let pain be your guide: If it hurts to do something, don't do it.  Pain is your body warning you to avoid that activity for another week until the pain goes down. d. You may drive when you are no longer taking prescription pain medication, you can comfortably wear a seatbelt, and you can safely maneuver your car and apply brakes. e. Bonita QuinYou may have sexual intercourse when it is comfortable.  9. FOLLOW UP in our office a. Please  call [facility] at [phone] to set up an appointment to see your surgeon in the office for a follow-up appointment approximately 2-3 weeks after your surgery. b. Make sure that you call for this appointment the day you arrive home to insure a convenient appointment time. 10. IF YOU HAVE DISABILITY OR FAMILY LEAVE FORMS, BRING THEM TO THE OFFICE FOR PROCESSING.  DO NOT GIVE THEM TO YOUR DOCTOR.   WHEN TO CALL US [phone]: 1. Poor pain control 2. Reactions / problems with new medications (rash/itching, nausea, etc)  3. Fever over 101.5 F (38.5 C) 4. Inability to urinate 5. Nausea and/or vomiting 6. Worsening swelling or bruising 7. Continued bleeding from incision. 8. Increased pain, redness, or drainage from the incision   The clinic staff is available to answer your questions during regular business hours (8:30am-5pm).  Please dont hesitate to call and ask to speak to one of our nurses for clinical concerns.   If you have a medical emergency, go to the nearest emergency room or call 911.  [facility] is always on call at the [facility]   [facility], [place] [address], [address], [place], [place]  [id number] ? MAIN: [phone] ? TOLL FREE: [phone] ?  FAX [phone] [email or web address]

## 2017-10-18 NOTE — Anesthesia Postprocedure Evaluation (Signed)
Anesthesia Post Note  Patient: Tabitha AblesMichele A Mcdonald  Procedure(s) Performed: LAPAROSCOPIC CHOLECYSTECTOMY (N/A Abdomen)     Patient location during evaluation: PACU Anesthesia Type: General Level of consciousness: awake and alert Pain management: pain level controlled Vital Signs Assessment: post-procedure vital signs reviewed and stable Respiratory status: spontaneous breathing, nonlabored ventilation, respiratory function stable and patient connected to nasal cannula oxygen Cardiovascular status: blood pressure returned to baseline and stable Postop Assessment: no apparent nausea or vomiting Anesthetic complications: no    Last Vitals:  Vitals:   10/18/17 0930 10/18/17 0944  BP: 109/71 (!) 92/57  Pulse: 88 92  Resp: 20 16  Temp: 36.5 C (!) 36.3 C  SpO2: 94% 96%    Last Pain:  Vitals:   10/18/17 0955  TempSrc:   PainSc: 6                  Kyasia Steuck S

## 2017-10-18 NOTE — Anesthesia Procedure Notes (Addendum)
Procedure Name: Intubation Date/Time: 10/18/2017 7:31 AM Performed by: Jhonnie GarnerMarshall, Broc Caspers M, CRNA Pre-anesthesia Checklist: Patient identified, Emergency Drugs available, Suction available and Patient being monitored Patient Re-evaluated:Patient Re-evaluated prior to induction Oxygen Delivery Method: Circle system utilized Preoxygenation: Pre-oxygenation with 100% oxygen Induction Type: IV induction Ventilation: Mask ventilation without difficulty Laryngoscope Size: Glidescope (Lo Pro 4) Grade View: Grade I Tube type: Oral Tube size: 7.5 mm Number of attempts: 1 Airway Equipment and Method: Stylet and C-Track utilized (patient positioned to comfort prior to induction.) Placement Confirmation: ETT inserted through vocal cords under direct vision,  positive ETCO2 and breath sounds checked- equal and bilateral Secured at: 21 cm Tube secured with: Tape Dental Injury: Teeth and Oropharynx as per pre-operative assessment

## 2017-10-18 NOTE — Transfer of Care (Signed)
Immediate Anesthesia Transfer of Care Note  Patient: Jarrett AblesMichele A Nosbisch  Procedure(s) Performed: LAPAROSCOPIC CHOLECYSTECTOMY (N/A Abdomen)  Patient Location: PACU  Anesthesia Type:General  Level of Consciousness: sedated  Airway & Oxygen Therapy: Patient Spontanous Breathing and Patient connected to face mask oxygen  Post-op Assessment: Report given to RN and Post -op Vital signs reviewed and stable  Post vital signs: Reviewed and stable  Last Vitals:  Vitals:   10/18/17 0539  BP: (!) 133/91  Pulse: 92  Resp: 18  Temp: 36.8 C  SpO2: 98%    Last Pain:  Vitals:   10/18/17 0539  TempSrc: Oral      Patients Stated Pain Goal: 4 (10/18/17 0558)  Complications: No apparent anesthesia complications

## 2017-10-19 ENCOUNTER — Encounter (HOSPITAL_COMMUNITY): Payer: Self-pay | Admitting: Surgery

## 2017-11-08 ENCOUNTER — Other Ambulatory Visit (HOSPITAL_COMMUNITY): Payer: Self-pay | Admitting: *Deleted

## 2017-11-08 ENCOUNTER — Encounter: Payer: Self-pay | Admitting: *Deleted

## 2017-11-08 ENCOUNTER — Emergency Department (INDEPENDENT_AMBULATORY_CARE_PROVIDER_SITE_OTHER): Payer: BLUE CROSS/BLUE SHIELD

## 2017-11-08 ENCOUNTER — Emergency Department (INDEPENDENT_AMBULATORY_CARE_PROVIDER_SITE_OTHER)
Admission: EM | Admit: 2017-11-08 | Discharge: 2017-11-08 | Disposition: A | Discharge status: Home / Self Care | Payer: BLUE CROSS/BLUE SHIELD | Source: Home / Self Care | Attending: Family Medicine | Admitting: Family Medicine

## 2017-11-08 ENCOUNTER — Other Ambulatory Visit: Payer: Self-pay

## 2017-11-08 DIAGNOSIS — S9031XA Contusion of right foot, initial encounter: Secondary | ICD-10-CM

## 2017-11-08 DIAGNOSIS — W2203XA Walked into furniture, initial encounter: Secondary | ICD-10-CM

## 2017-11-08 DIAGNOSIS — S92501A Displaced unspecified fracture of right lesser toe(s), initial encounter for closed fracture: Secondary | ICD-10-CM | POA: Diagnosis not present

## 2017-11-08 DIAGNOSIS — S92514A Nondisplaced fracture of proximal phalanx of right lesser toe(s), initial encounter for closed fracture: Secondary | ICD-10-CM | POA: Diagnosis not present

## 2017-11-08 DIAGNOSIS — M79671 Pain in right foot: Secondary | ICD-10-CM

## 2017-11-08 DIAGNOSIS — S90121A Contusion of right lesser toe(s) without damage to nail, initial encounter: Secondary | ICD-10-CM

## 2017-11-08 MED ORDER — TRAMADOL HCL 50 MG PO TABS: 50.0000 mg | ORAL_TABLET | Freq: Two times a day (BID) | ORAL | 0 refills | Status: DC | PRN

## 2017-11-08 NOTE — Discharge Instructions (Signed)
°  Tramadol is strong pain medication. While taking, do not drink alcohol, drive, or perform any other activities that requires focus while taking these medications.  ° °

## 2017-11-08 NOTE — ED Provider Notes (Signed)
Tabitha Mcdonald    CSN: [id number] Arrival date & time: 11/08/17  1151     History   Chief Complaint Chief Complaint  Patient presents with  . Foot Injury    HPI Tabitha AblesMichele A Mcdonald is a 61 y.o. female.   HPI  Tabitha AblesMichele A Haltiwanger is a 61 y.o. female presenting to UC with c/o gradually worsening Right foot pain after hitting her Right foot, mainly her Right 4th toe, on the metal railing of her bed 2 nights ago when getting up in the middle of the night.  Since then she has noticed worsening bruising and pain to her 4th toe and top of Right foot. No prior injury to same toe or foot.  Pain is 8/10, aching. She has soaked her foot and has taken Tylenol with mild relief.    Past Medical History:  Diagnosis Date  . Arthritis   . Asthma    seasonal  . Back pain   . Cervical disc disorder with radiculopathy 01/21/2015  . Complication of anesthesia    needs Glidescope for intubation as had cervical neck surgery  . GERD (gastroesophageal reflux disease)   . History of hiatal hernia    small hernia    Patient Active Problem List   Diagnosis Date Noted  . Chronic insomnia 05/18/2016  . Cervical disc disorder with radiculopathy 01/21/2015    Past Surgical History:  Procedure Laterality Date  . ABDOMINAL HYSTERECTOMY    . APPENDECTOMY    . BACK SURGERY    . CERVICAL SPINE SURGERY     C5-C6  . CESAREAN SECTION    . CHOLECYSTECTOMY N/A 10/18/2017   Procedure: LAPAROSCOPIC CHOLECYSTECTOMY;  Surgeon: Berna Bueonnor, Chelsea A, MD;  Location: [facility];  Service: General;  Laterality: N/A;  . DILATION AND CURETTAGE OF UTERUS    . TONSILLECTOMY      OB History    Gravida Para Term Preterm AB Living   2 2       1    SAB TAB Ectopic Multiple Live Births                   Home Medications    Prior to Admission medications   Medication Sig Start Date End Date Taking? Authorizing Provider  zolpidem (AMBIEN) 10 MG tablet Take 0.5 tablets (5 mg total) by mouth at bedtime. Patient taking  differently: Take 10 mg by mouth at bedtime.  10/18/16  Yes Dove, Myra C, MD  albuterol (PROVENTIL HFA;VENTOLIN HFA) 108 (90 Base) MCG/ACT inhaler Inhale 2 puffs into the lungs every 6 (six) hours as needed for wheezing. 01/27/17   Sunnie NielsenAlexander, Natalie, DO  Cholecalciferol (VITAMIN D3 PO) Take 1 capsule by mouth daily.    [provider]  docusate sodium (COLACE) 100 MG capsule Take 1 capsule (100 mg total) 2 (two) times daily by mouth. 10/18/17 11/17/17  Berna Bueonnor, Chelsea A, MD  estradiol (VIVELLE-DOT) 0.025 MG/24HR Place 1 patch onto the skin 2 (two) times a week. 10/18/16   Allie Bossierove, Myra C, MD  fexofenadine (ALLEGRA) 180 MG tablet Take 180 mg by mouth daily as needed.     [provider]  oxyCODONE-acetaminophen (PERCOCET/ROXICET) 5-325 MG tablet Take 1 tablet every 6 (six) hours as needed by mouth for severe pain. 10/18/17   Berna Bueonnor, Chelsea A, MD  pantoprazole (PROTONIX) 40 MG tablet Take 40 mg by mouth daily. 07/08/17   [provider]  traMADol (ULTRAM) 50 MG tablet Take 1 tablet (50 mg total) by mouth every  12 (twelve) hours as needed for moderate pain or severe pain. 11/08/17   Lurene ShadowPhelps, Stark Aguinaga O, PA-C    Family History Family History  Problem Relation Age of Onset  . Hypertension Father   . Diabetes Son     Social History Social History   Tobacco Use  . Smoking status: Never Smoker  . Smokeless tobacco: Never Used  Substance Use Topics  . Alcohol use: No    Alcohol/week: 0.0 oz  . Drug use: No     Allergies   Ibuprofen and Zofran [ondansetron hcl]   Review of Systems Review of Systems  Musculoskeletal: Positive for myalgias. Negative for arthralgias and joint swelling.  Skin: Positive for color change. Negative for wound.  Neurological: Negative for weakness and numbness.     Physical Exam Triage Vital Signs ED Triage Vitals  Enc Vitals Group     BP 11/08/17 1209 136/84     Pulse Rate 11/08/17 1209 76     Resp 11/08/17 1209 16     Temp 11/08/17 1209  98 F (36.7 C)     Temp Source 11/08/17 1209 Oral     SpO2 11/08/17 1209 99 %     Weight 11/08/17 1209 182 lb (82.6 kg)     Height 11/08/17 1209 5\' 4"  (1.626 m)     Head Circumference --      Peak Flow --      Pain Score 11/08/17 1210 8     Pain Loc --      Pain Edu? --      Excl. in GC? --    No data found.  Updated Vital Signs BP 136/84 (BP Location: Left Arm)   Pulse 76   Temp 98 F (36.7 C) (Oral)   Resp 16   Ht 5\' 4"  (1.626 m)   Wt 182 lb (82.6 kg)   SpO2 99%   BMI 31.24 kg/m   Visual Acuity Right Eye Distance:   Left Eye Distance:   Bilateral Distance:    Right Eye Near:   Left Eye Near:    Bilateral Near:     Physical Exam  Constitutional: She is oriented to person, place, and time. She appears well-developed and well-nourished. No distress.  HENT:  Head: Normocephalic and atraumatic.  Eyes: EOM are normal.  Neck: Normal range of motion.  Cardiovascular: Normal rate.  Pulses:      Dorsalis pedis pulses are 2+ on the right side.       Posterior tibial pulses are 2+ on the right side.  Pulmonary/Chest: Effort normal.  Musculoskeletal: Normal range of motion. She exhibits tenderness. She exhibits no edema.  Right foot: tenderness to 4th toe and dorsal aspect of 3rd and 4th metatarsals.   Full ROM ankle and toes.   Neurological: She is alert and oriented to person, place, and time.  Skin: Skin is warm and dry. Capillary refill takes less than 2 seconds. She is not diaphoretic.  Right foot: skin in tact. Ecchymosis or 4th toe and over 3rd and 4th metatarsals.   Psychiatric: She has a normal mood and affect. Her behavior is normal.  Nursing note and vitals reviewed.    UC Treatments / Results  Labs (all labs ordered are listed, but only abnormal results are displayed) Labs Reviewed - No data to display  EKG  EKG Interpretation None       Radiology Dg Foot Complete Right  Result Date: 11/08/2017 CLINICAL DATA:  Acute right foot pain following  kicking  injury. Initial encounter. EXAM: RIGHT FOOT COMPLETE - 3+ VIEW COMPARISON:  None. FINDINGS: A nondisplaced oblique fracture of the fourth toe proximal phalanx is identified. There is no evidence of subluxation or dislocation. No other fractures are noted. IMPRESSION: Nondisplaced fracture of the fourth toe proximal phalanx. Electronically Signed   By: Harmon Pier M.D.   On: 11/08/2017 12:39    Procedures Procedures (including critical Mcdonald time)  Medications Ordered in UC Medications - No data to display   Initial Impression / Assessment and Plan / UC Course  I have reviewed the triage vital signs and the nursing notes.  Pertinent labs & imaging results that were available during my Mcdonald of the patient were reviewed by me and considered in my medical decision making (see chart for details).     Hx and exam c/w Right 4th proximal phalanx fracture Discussed treatment with post-op shoe vs boot, pt prefers walking boot as flexion and extension at ankle causes pain even w/o weight bearing.    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 Consulted with Sports Medicine. Advised pt would likely not be cleared to drive until she can quickly apply 40 lbs of pressure onto Right foot. Work note provided until Friday, 11/11/17, when she is scheduled to have cervical spine surgery.   Advised to f/u with Sports Medicine for continued monitoring and treatment of Right foot fracture and clearance to return to work w/o restrictions.   Final Clinical Impressions(s) / UC Diagnoses   Final diagnoses:  Right foot pain  Contusion of right foot, initial encounter  Contusion of fourth toe of right foot, initial encounter  Closed fracture of phalanx of right fourth toe, initial encounter    ED Discharge Orders        Ordered    traMADol (ULTRAM) 50 MG tablet  Every 12 hours PRN     11/08/17 1305       Controlled Substance Prescriptions [place] Controlled Substance Registry consulted? Yes, I have consulted  [place] Controlled Substances Registry for this patient, and feel the risk/benefit ratio today is favorable for proceeding with this prescription for a controlled substance.   Lurene Shadow, [place] 11/08/17 1317

## 2017-11-08 NOTE — ED Triage Notes (Signed)
Pt c/o RT foot and RT 4th toe pain and bruising post injury on 11/06/17. She reports hitting her foot on the rail of her bed. Last dose Tylenol last night.

## 2017-11-08 NOTE — Pre-Procedure Instructions (Signed)
Tabitha AblesMichele A Haze  11/08/2017    Your procedure is scheduled on Friday, November 11, 2017 at 1:45 PM.   [facility] En[facility] Admitting Office at 11:45 AM.   Call this number if you have problems the morning of surgery: [phone]   Questions prior to day of surgery, please call [phone] between 8 & 4 PM.   Remember:  Do not eat food or drink liquids after midnight Thursday, 11/10/17.  Take these medicines the morning of surgery with A SIP OF WATER: Pantoprazole (Protonix), Fexofenadine (Allegra) - if needed, Oxycodone or Tramadol - if needed, Albuterol inhaler - if needed (bring inhaler with you day of surgery)  Do not use Aspirin products or NSAIDS (Ibuprofen, Aleve, etc) prior to surgery.   Do not wear jewelry, make-up or nail polish.  Do not wear lotions, powders, perfumes or deodorant.  Do not shave 48 hours prior to surgery.    Do not bring valuables to the hospital.  [facility] not [facility] for any belongings or valuables.  Contacts, dentures or bridgework may not be worn into surgery.  Leave your suitcase in the car.  After surgery it may be brought to your room.  For patients admitted to the hospital, discharge time will be determined by your treatment team.  [facility] - Preparing for Surgery  Before surgery, you can play an important role.  Because skin is not sterile, your skin needs to be as free of germs as possible.  You can reduce the number of germs on you skin by washing with CHG (chlorahexidine gluconate) soap before surgery.  CHG is an antiseptic cleaner which kills germs and bonds with the skin to continue killing germs even after washing.  Please DO NOT use if you have an allergy to CHG or antibacterial soaps.  If your skin becomes reddened/irritated stop using the CHG and inform your nurse when you arrive at Short Stay.  Do not shave (including legs and underarms) for at least 48 hours prior to the first CHG shower.  You may  shave your face.  Please follow these instructions carefully:   1.  Shower with CHG Soap the night before surgery and the                    morning of Surgery.  2.  If you choose to wash your hair, wash your hair first as usual with your       normal shampoo.  3.  After you shampoo, rinse your hair and body thoroughly to remove the shampoo.  4.  Use CHG as you would any other liquid soap.  You can apply chg directly       to the skin and wash gently with scrungie or a clean washcloth.  5.  Apply the CHG Soap to your body ONLY FROM THE NECK DOWN.        Do not use on open wounds or open sores.  Avoid contact with your eyes, ears, mouth and genitals (private parts).  Wash genitals (private parts) with your normal soap.  6.  Wash thoroughly, paying special attention to the area where your surgery        will be performed.  7.  Thoroughly rinse your body with warm water from the neck down.  8.  DO NOT shower/wash with your normal soap after using and rinsing off       the CHG Soap.  9.  Pat yourself dry  with a clean towel.            10.  Wear clean pajamas.            11.  Place clean sheets on your bed the night of your first shower and do not        sleep with pets.  Day of Surgery  Do not apply any lotions/deodorants the morning of surgery.  Please wear clean clothes to the hospital.   Please read over the fact sheets that you were given.

## 2017-11-09 ENCOUNTER — Other Ambulatory Visit: Payer: Self-pay

## 2017-11-09 ENCOUNTER — Encounter (HOSPITAL_COMMUNITY): Payer: Self-pay

## 2017-11-09 ENCOUNTER — Encounter (HOSPITAL_COMMUNITY)
Admission: RE | Admit: 2017-11-09 | Discharge: 2017-11-09 | Disposition: A | Discharge status: Home / Self Care | Payer: BLUE CROSS/BLUE SHIELD | Source: Ambulatory Visit | Attending: Neurosurgery | Admitting: Neurosurgery

## 2017-11-09 DIAGNOSIS — Z90710 Acquired absence of both cervix and uterus: Secondary | ICD-10-CM | POA: Diagnosis not present

## 2017-11-09 DIAGNOSIS — Z888 Allergy status to other drugs, medicaments and biological substances status: Secondary | ICD-10-CM | POA: Diagnosis not present

## 2017-11-09 DIAGNOSIS — G4700 Insomnia, unspecified: Secondary | ICD-10-CM | POA: Diagnosis not present

## 2017-11-09 DIAGNOSIS — J45909 Unspecified asthma, uncomplicated: Secondary | ICD-10-CM | POA: Diagnosis not present

## 2017-11-09 DIAGNOSIS — Z885 Allergy status to narcotic agent status: Secondary | ICD-10-CM | POA: Diagnosis not present

## 2017-11-09 DIAGNOSIS — Z79899 Other long term (current) drug therapy: Secondary | ICD-10-CM | POA: Diagnosis not present

## 2017-11-09 DIAGNOSIS — Z880 Allergy status to penicillin: Secondary | ICD-10-CM | POA: Diagnosis not present

## 2017-11-09 DIAGNOSIS — Z9889 Other specified postprocedural states: Secondary | ICD-10-CM | POA: Diagnosis not present

## 2017-11-09 DIAGNOSIS — K219 Gastro-esophageal reflux disease without esophagitis: Secondary | ICD-10-CM | POA: Diagnosis not present

## 2017-11-09 DIAGNOSIS — M5412 Radiculopathy, cervical region: Secondary | ICD-10-CM | POA: Diagnosis present

## 2017-11-09 DIAGNOSIS — Z9049 Acquired absence of other specified parts of digestive tract: Secondary | ICD-10-CM | POA: Diagnosis not present

## 2017-11-09 DIAGNOSIS — K449 Diaphragmatic hernia without obstruction or gangrene: Secondary | ICD-10-CM | POA: Diagnosis not present

## 2017-11-09 DIAGNOSIS — Z886 Allergy status to analgesic agent status: Secondary | ICD-10-CM | POA: Diagnosis not present

## 2017-11-09 DIAGNOSIS — M5093 Cervical disc disorder, unspecified, cervicothoracic region: Secondary | ICD-10-CM | POA: Diagnosis present

## 2017-11-09 HISTORY — DX: Insomnia, unspecified: G47.00

## 2017-11-09 HISTORY — DX: Pneumonia, unspecified organism: J18.9

## 2017-11-09 LAB — CBC
HCT: 40.1 % (ref 36.0–46.0)
Hemoglobin: 13.2 g/dL (ref 12.0–15.0)
MCH: 29.1 pg (ref 26.0–34.0)
MCHC: 32.9 g/dL (ref 30.0–36.0)
MCV: 88.5 fL (ref 78.0–100.0)
PLATELETS: 213 10*3/uL (ref 150–400)
RBC: 4.53 MIL/uL (ref 3.87–5.11)
RDW: 13.3 % (ref 11.5–15.5)
WBC: 4.3 10*3/uL (ref 4.0–10.5)

## 2017-11-09 LAB — TYPE AND SCREEN
ABO/RH(D): B POS
Antibody Screen: NEGATIVE

## 2017-11-09 LAB — SURGICAL PCR SCREEN
MRSA, PCR: NEGATIVE
Staphylococcus aureus: NEGATIVE

## 2017-11-09 NOTE — Progress Notes (Signed)
Pt denies cardiac history, chest pain or sob. Pt states she is not diabetic. Pt had a cholecystectomy on 10/18/17.

## 2017-11-11 ENCOUNTER — Encounter (HOSPITAL_COMMUNITY)
Admission: RE | Disposition: A | Discharge status: Home / Self Care | Payer: Self-pay | Source: Ambulatory Visit | Attending: Neurosurgery

## 2017-11-11 ENCOUNTER — Ambulatory Visit (HOSPITAL_COMMUNITY): Payer: BLUE CROSS/BLUE SHIELD | Admitting: Certified Registered Nurse Anesthetist

## 2017-11-11 ENCOUNTER — Other Ambulatory Visit: Payer: Self-pay

## 2017-11-11 ENCOUNTER — Encounter (HOSPITAL_COMMUNITY): Payer: Self-pay

## 2017-11-11 ENCOUNTER — Ambulatory Visit (HOSPITAL_COMMUNITY): Payer: BLUE CROSS/BLUE SHIELD | Admitting: Emergency Medicine

## 2017-11-11 ENCOUNTER — Ambulatory Visit (HOSPITAL_COMMUNITY): Payer: BLUE CROSS/BLUE SHIELD

## 2017-11-11 ENCOUNTER — Observation Stay (HOSPITAL_COMMUNITY)
Admission: RE | Admit: 2017-11-11 | Discharge: 2017-11-12 | Disposition: A | Discharge status: Home / Self Care | Payer: BLUE CROSS/BLUE SHIELD | Source: Ambulatory Visit | Attending: Neurosurgery | Admitting: Neurosurgery

## 2017-11-11 DIAGNOSIS — Z885 Allergy status to narcotic agent status: Secondary | ICD-10-CM | POA: Insufficient documentation

## 2017-11-11 DIAGNOSIS — G4700 Insomnia, unspecified: Secondary | ICD-10-CM | POA: Insufficient documentation

## 2017-11-11 DIAGNOSIS — Z886 Allergy status to analgesic agent status: Secondary | ICD-10-CM | POA: Insufficient documentation

## 2017-11-11 DIAGNOSIS — Z79899 Other long term (current) drug therapy: Secondary | ICD-10-CM | POA: Insufficient documentation

## 2017-11-11 DIAGNOSIS — Z419 Encounter for procedure for purposes other than remedying health state, unspecified: Secondary | ICD-10-CM

## 2017-11-11 DIAGNOSIS — Z888 Allergy status to other drugs, medicaments and biological substances status: Secondary | ICD-10-CM | POA: Insufficient documentation

## 2017-11-11 DIAGNOSIS — J45909 Unspecified asthma, uncomplicated: Secondary | ICD-10-CM | POA: Insufficient documentation

## 2017-11-11 DIAGNOSIS — Z9889 Other specified postprocedural states: Secondary | ICD-10-CM | POA: Insufficient documentation

## 2017-11-11 DIAGNOSIS — Z9049 Acquired absence of other specified parts of digestive tract: Secondary | ICD-10-CM | POA: Insufficient documentation

## 2017-11-11 DIAGNOSIS — M5412 Radiculopathy, cervical region: Secondary | ICD-10-CM | POA: Insufficient documentation

## 2017-11-11 DIAGNOSIS — M5093 Cervical disc disorder, unspecified, cervicothoracic region: Secondary | ICD-10-CM | POA: Diagnosis not present

## 2017-11-11 DIAGNOSIS — Z880 Allergy status to penicillin: Secondary | ICD-10-CM | POA: Insufficient documentation

## 2017-11-11 DIAGNOSIS — Z90710 Acquired absence of both cervix and uterus: Secondary | ICD-10-CM | POA: Insufficient documentation

## 2017-11-11 DIAGNOSIS — K219 Gastro-esophageal reflux disease without esophagitis: Secondary | ICD-10-CM | POA: Insufficient documentation

## 2017-11-11 DIAGNOSIS — K449 Diaphragmatic hernia without obstruction or gangrene: Secondary | ICD-10-CM | POA: Insufficient documentation

## 2017-11-11 HISTORY — PX: ANTERIOR CERVICAL DECOMP/DISCECTOMY FUSION: SHX1161

## 2017-11-11 SURGERY — ANTERIOR CERVICAL DECOMPRESSION/DISCECTOMY FUSION 1 LEVEL/HARDWARE REMOVAL
Anesthesia: General | Site: Neck

## 2017-11-11 MED ORDER — BACITRACIN 50000 UNITS IM SOLR
INTRAMUSCULAR | Status: DC | PRN
  Administered 2017-11-11: 500 mL

## 2017-11-11 MED ORDER — PROMETHAZINE HCL 25 MG/ML IJ SOLN
INTRAMUSCULAR | Status: AC
  Administered 2017-11-11: 6.25 mg via INTRAVENOUS
  Filled 2017-11-11: qty 1

## 2017-11-11 MED ORDER — LACTATED RINGERS IV SOLN
INTRAVENOUS | Status: DC
  Administered 2017-11-11 (×3): via INTRAVENOUS

## 2017-11-11 MED ORDER — HYDROCODONE-ACETAMINOPHEN 5-325 MG PO TABS
1.0000 | ORAL_TABLET | ORAL | Status: DC | PRN
  Administered 2017-11-11: 1 via ORAL

## 2017-11-11 MED ORDER — POLYETHYLENE GLYCOL 3350 17 G PO PACK: 17.0000 g | PACK | Freq: Every day | ORAL | Status: DC | PRN

## 2017-11-11 MED ORDER — ACETAMINOPHEN 325 MG PO TABS: 650.0000 mg | ORAL_TABLET | ORAL | Status: DC | PRN

## 2017-11-11 MED ORDER — HYDROMORPHONE HCL 1 MG/ML IJ SOLN
INTRAMUSCULAR | Status: AC
  Administered 2017-11-11: 0.5 mg via INTRAVENOUS
  Filled 2017-11-11: qty 1

## 2017-11-11 MED ORDER — METHOCARBAMOL 500 MG PO TABS
500.0000 mg | ORAL_TABLET | Freq: Four times a day (QID) | ORAL | Status: DC | PRN
  Administered 2017-11-11 – 2017-11-12 (×2): 500 mg via ORAL
  Filled 2017-11-11 (×4): qty 1

## 2017-11-11 MED ORDER — SODIUM CHLORIDE 0.9% FLUSH: 3.0000 mL | INTRAVENOUS | Status: DC | PRN

## 2017-11-11 MED ORDER — HYDROMORPHONE HCL 1 MG/ML IJ SOLN
0.2500 mg | INTRAMUSCULAR | Status: DC | PRN
  Administered 2017-11-11 (×2): 0.5 mg via INTRAVENOUS

## 2017-11-11 MED ORDER — LIDOCAINE-EPINEPHRINE 1 %-1:100000 IJ SOLN
INTRAMUSCULAR | Status: AC
  Filled 2017-11-11: qty 1

## 2017-11-11 MED ORDER — THROMBIN (RECOMBINANT) 5000 UNITS EX SOLR
CUTANEOUS | Status: AC
  Filled 2017-11-11: qty 5000

## 2017-11-11 MED ORDER — PROPOFOL 10 MG/ML IV BOLUS
INTRAVENOUS | Status: DC | PRN
  Administered 2017-11-11: 160 mg via INTRAVENOUS

## 2017-11-11 MED ORDER — 0.9 % SODIUM CHLORIDE (POUR BTL) OPTIME
TOPICAL | Status: DC | PRN
Start: 2017-11-11 — End: 2017-11-11
  Administered 2017-11-11: 1000 mL

## 2017-11-11 MED ORDER — HYDROCODONE-ACETAMINOPHEN 5-325 MG PO TABS
ORAL_TABLET | ORAL | Status: AC
  Administered 2017-11-11: 1 via ORAL
  Filled 2017-11-11: qty 1

## 2017-11-11 MED ORDER — ALBUTEROL SULFATE (2.5 MG/3ML) 0.083% IN NEBU: 2.5000 mg | INHALATION_SOLUTION | Freq: Four times a day (QID) | RESPIRATORY_TRACT | Status: DC | PRN

## 2017-11-11 MED ORDER — CHLORHEXIDINE GLUCONATE CLOTH 2 % EX PADS: 6.0000 | MEDICATED_PAD | Freq: Once | CUTANEOUS | Status: DC

## 2017-11-11 MED ORDER — BUPIVACAINE HCL (PF) 0.5 % IJ SOLN
INTRAMUSCULAR | Status: AC
  Filled 2017-11-11: qty 30

## 2017-11-11 MED ORDER — BUPIVACAINE HCL 0.5 % IJ SOLN
INTRAMUSCULAR | Status: DC | PRN
  Administered 2017-11-11: 3 mL

## 2017-11-11 MED ORDER — PHENYLEPHRINE 40 MCG/ML (10ML) SYRINGE FOR IV PUSH (FOR BLOOD PRESSURE SUPPORT)
PREFILLED_SYRINGE | INTRAVENOUS | Status: AC
  Filled 2017-11-11: qty 10

## 2017-11-11 MED ORDER — DOCUSATE SODIUM 100 MG PO CAPS
100.0000 mg | ORAL_CAPSULE | Freq: Two times a day (BID) | ORAL | Status: DC
  Administered 2017-11-11 – 2017-11-12 (×2): 100 mg via ORAL
  Filled 2017-11-11 (×2): qty 1

## 2017-11-11 MED ORDER — FENTANYL CITRATE (PF) 250 MCG/5ML IJ SOLN
INTRAMUSCULAR | Status: AC
  Filled 2017-11-11: qty 5

## 2017-11-11 MED ORDER — SUGAMMADEX SODIUM 200 MG/2ML IV SOLN
INTRAVENOUS | Status: AC
  Filled 2017-11-11: qty 2

## 2017-11-11 MED ORDER — LIDOCAINE 2% (20 MG/ML) 5 ML SYRINGE
INTRAMUSCULAR | Status: AC
  Filled 2017-11-11: qty 5

## 2017-11-11 MED ORDER — SODIUM CHLORIDE 0.9 % IV SOLN: INTRAVENOUS | Status: DC

## 2017-11-11 MED ORDER — DEXAMETHASONE SODIUM PHOSPHATE 10 MG/ML IJ SOLN
INTRAMUSCULAR | Status: AC
  Filled 2017-11-11: qty 1

## 2017-11-11 MED ORDER — LIDOCAINE HCL (CARDIAC) 20 MG/ML IV SOLN
INTRAVENOUS | Status: DC | PRN
  Administered 2017-11-11: 80 mg via INTRAVENOUS

## 2017-11-11 MED ORDER — BISACODYL 10 MG RE SUPP: 10.0000 mg | Freq: Every day | RECTAL | Status: DC | PRN

## 2017-11-11 MED ORDER — CEFAZOLIN SODIUM-DEXTROSE 2-4 GM/100ML-% IV SOLN
2.0000 g | INTRAVENOUS | Status: AC
  Administered 2017-11-11: 2 g via INTRAVENOUS
  Filled 2017-11-11: qty 100

## 2017-11-11 MED ORDER — LORATADINE 10 MG PO TABS
10.0000 mg | ORAL_TABLET | Freq: Every day | ORAL | Status: DC
  Filled 2017-11-11 (×2): qty 1

## 2017-11-11 MED ORDER — ACETAMINOPHEN 650 MG RE SUPP: 650.0000 mg | RECTAL | Status: DC | PRN

## 2017-11-11 MED ORDER — LIDOCAINE-EPINEPHRINE 1 %-1:100000 IJ SOLN
INTRAMUSCULAR | Status: DC | PRN
Start: 2017-11-11 — End: 2017-11-11
  Administered 2017-11-11: 3 mL

## 2017-11-11 MED ORDER — MIDAZOLAM HCL 5 MG/5ML IJ SOLN
INTRAMUSCULAR | Status: DC | PRN
Start: 2017-11-11 — End: 2017-11-11
  Administered 2017-11-11: 2 mg via INTRAVENOUS

## 2017-11-11 MED ORDER — CEFAZOLIN SODIUM-DEXTROSE 2-4 GM/100ML-% IV SOLN
2.0000 g | Freq: Three times a day (TID) | INTRAVENOUS | Status: AC
  Administered 2017-11-11 – 2017-11-12 (×2): 2 g via INTRAVENOUS
  Filled 2017-11-11 (×2): qty 100

## 2017-11-11 MED ORDER — METHOCARBAMOL 500 MG PO TABS
ORAL_TABLET | ORAL | Status: AC
Start: 2017-11-11 — End: 2017-11-11
  Administered 2017-11-11: 500 mg via ORAL
  Filled 2017-11-11: qty 1

## 2017-11-11 MED ORDER — ROCURONIUM BROMIDE 100 MG/10ML IV SOLN
INTRAVENOUS | Status: DC | PRN
  Administered 2017-11-11: 65 mg via INTRAVENOUS

## 2017-11-11 MED ORDER — VITAMIN D3 25 MCG (1000 UNIT) PO TABS
1000.0000 [IU] | ORAL_TABLET | Freq: Every day | ORAL | Status: DC
  Administered 2017-11-12: 1000 [IU] via ORAL
  Filled 2017-11-11 (×2): qty 1

## 2017-11-11 MED ORDER — SUGAMMADEX SODIUM 200 MG/2ML IV SOLN
INTRAVENOUS | Status: DC | PRN
  Administered 2017-11-11: 170 mg via INTRAVENOUS

## 2017-11-11 MED ORDER — SUCCINYLCHOLINE CHLORIDE 200 MG/10ML IV SOSY
PREFILLED_SYRINGE | INTRAVENOUS | Status: AC
  Filled 2017-11-11: qty 20

## 2017-11-11 MED ORDER — HYDROCODONE-ACETAMINOPHEN 10-325 MG PO TABS
2.0000 | ORAL_TABLET | ORAL | Status: DC | PRN
  Administered 2017-11-11 – 2017-11-12 (×4): 2 via ORAL
  Filled 2017-11-11 (×4): qty 2

## 2017-11-11 MED ORDER — SENNA 8.6 MG PO TABS
1.0000 | ORAL_TABLET | Freq: Two times a day (BID) | ORAL | Status: DC
  Administered 2017-11-11 – 2017-11-12 (×2): 8.6 mg via ORAL
  Filled 2017-11-11 (×2): qty 1

## 2017-11-11 MED ORDER — ROCURONIUM BROMIDE 10 MG/ML (PF) SYRINGE
PREFILLED_SYRINGE | INTRAVENOUS | Status: AC
  Filled 2017-11-11: qty 5

## 2017-11-11 MED ORDER — PHENOL 1.4 % MT LIQD: 1.0000 | OROMUCOSAL | Status: DC | PRN

## 2017-11-11 MED ORDER — DEXAMETHASONE SODIUM PHOSPHATE 10 MG/ML IJ SOLN
INTRAMUSCULAR | Status: DC | PRN
  Administered 2017-11-11: 10 mg via INTRAVENOUS

## 2017-11-11 MED ORDER — MENTHOL 3 MG MT LOZG
1.0000 | LOZENGE | OROMUCOSAL | Status: DC | PRN
Start: 2017-11-11 — End: 2017-11-12

## 2017-11-11 MED ORDER — ZOLPIDEM TARTRATE 5 MG PO TABS
5.0000 mg | ORAL_TABLET | Freq: Every day | ORAL | Status: DC
  Administered 2017-11-11: 5 mg via ORAL
  Filled 2017-11-11: qty 1

## 2017-11-11 MED ORDER — THROMBIN (RECOMBINANT) 5000 UNITS EX SOLR
CUTANEOUS | Status: DC | PRN
  Administered 2017-11-11: 5 mL via TOPICAL

## 2017-11-11 MED ORDER — EPHEDRINE 5 MG/ML INJ
INTRAVENOUS | Status: AC
  Filled 2017-11-11: qty 10

## 2017-11-11 MED ORDER — PANTOPRAZOLE SODIUM 40 MG PO TBEC
40.0000 mg | DELAYED_RELEASE_TABLET | Freq: Every day | ORAL | Status: DC
  Administered 2017-11-11 – 2017-11-12 (×2): 40 mg via ORAL
  Filled 2017-11-11 (×2): qty 1

## 2017-11-11 MED ORDER — MIDAZOLAM HCL 2 MG/2ML IJ SOLN
INTRAMUSCULAR | Status: AC
  Filled 2017-11-11: qty 2

## 2017-11-11 MED ORDER — MORPHINE SULFATE (PF) 4 MG/ML IV SOLN
2.0000 mg | INTRAVENOUS | Status: DC | PRN
  Administered 2017-11-11: 2 mg via INTRAVENOUS
  Filled 2017-11-11: qty 1

## 2017-11-11 MED ORDER — PHENYLEPHRINE HCL 10 MG/ML IJ SOLN
INTRAVENOUS | Status: DC | PRN
  Administered 2017-11-11: 30 ug/min via INTRAVENOUS

## 2017-11-11 MED ORDER — FLEET ENEMA 7-19 GM/118ML RE ENEM: 1.0000 | ENEMA | Freq: Once | RECTAL | Status: DC | PRN

## 2017-11-11 MED ORDER — SODIUM CHLORIDE 0.9% FLUSH: 3.0000 mL | Freq: Two times a day (BID) | INTRAVENOUS | Status: DC

## 2017-11-11 MED ORDER — FENTANYL CITRATE (PF) 100 MCG/2ML IJ SOLN
INTRAMUSCULAR | Status: DC | PRN
  Administered 2017-11-11 (×6): 50 ug via INTRAVENOUS

## 2017-11-11 MED ORDER — PROMETHAZINE HCL 25 MG/ML IJ SOLN
6.2500 mg | INTRAMUSCULAR | Status: DC | PRN
  Administered 2017-11-11: 6.25 mg via INTRAVENOUS

## 2017-11-11 MED ORDER — METHOCARBAMOL 1000 MG/10ML IJ SOLN
500.0000 mg | Freq: Four times a day (QID) | INTRAVENOUS | Status: DC | PRN
  Filled 2017-11-11: qty 5

## 2017-11-11 SURGICAL SUPPLY — 67 items
BAG DECANTER FOR FLEXI CONT (MISCELLANEOUS) ×2 IMPLANT
BASKET BONE COLLECTION (BASKET) ×2 IMPLANT
BENZOIN TINCTURE PRP APPL 2/3 (GAUZE/BANDAGES/DRESSINGS) IMPLANT
BLADE CLIPPER SURG (BLADE) IMPLANT
BLADE SURG 11 STRL SS (BLADE) ×2 IMPLANT
BLADE ULTRA TIP 2M (BLADE) IMPLANT
BUR MATCHSTICK NEURO 3.0 LAGG (BURR) ×2 IMPLANT
CAGE SPINAL 6X12 LORDOTIC (Cage) ×2 IMPLANT
CANISTER SUCT 3000ML PPV (MISCELLANEOUS) ×2 IMPLANT
CARTRIDGE OIL MAESTRO DRILL (MISCELLANEOUS) ×1 IMPLANT
DECANTER SPIKE VIAL GLASS SM (MISCELLANEOUS) ×4 IMPLANT
DERMABOND ADVANCED (GAUZE/BANDAGES/DRESSINGS) ×1
DERMABOND ADVANCED .7 DNX12 (GAUZE/BANDAGES/DRESSINGS) ×1 IMPLANT
DIFFUSER DRILL AIR PNEUMATIC (MISCELLANEOUS) ×2 IMPLANT
DRAIN CHANNEL 10M FLAT 3/4 FLT (DRAIN) IMPLANT
DRAPE C-ARM 42X72 X-RAY (DRAPES) ×4 IMPLANT
DRAPE HALF SHEET 40X57 (DRAPES) IMPLANT
DRAPE LAPAROTOMY 100X72 PEDS (DRAPES) ×2 IMPLANT
DRAPE MICROSCOPE LEICA (MISCELLANEOUS) ×2 IMPLANT
DRAPE POUCH INSTRU U-SHP 10X18 (DRAPES) ×2 IMPLANT
DRSG OPSITE 4X5.5 SM (GAUZE/BANDAGES/DRESSINGS) ×6 IMPLANT
DRSG OPSITE POSTOP 3X4 (GAUZE/BANDAGES/DRESSINGS) ×2 IMPLANT
DRSG OPSITE POSTOP 4X6 (GAUZE/BANDAGES/DRESSINGS) ×2 IMPLANT
DURAPREP 6ML APPLICATOR 50/CS (WOUND CARE) ×2 IMPLANT
ELECT COATED BLADE 2.86 ST (ELECTRODE) ×2 IMPLANT
ELECT REM PT RETURN 9FT ADLT (ELECTROSURGICAL) ×2
ELECTRODE REM PT RTRN 9FT ADLT (ELECTROSURGICAL) ×1 IMPLANT
EVACUATOR SILICONE 100CC (DRAIN) IMPLANT
GAUZE SPONGE 4X4 16PLY XRAY LF (GAUZE/BANDAGES/DRESSINGS) IMPLANT
GLOVE BIO SURGEON STRL SZ7.5 (GLOVE) IMPLANT
GLOVE BIOGEL M 8.0 STRL (GLOVE) ×2 IMPLANT
GLOVE BIOGEL M SZ8.5 STRL (GLOVE) ×2 IMPLANT
GLOVE BIOGEL PI IND STRL 7.5 (GLOVE) ×2 IMPLANT
GLOVE BIOGEL PI INDICATOR 7.5 (GLOVE) ×2
GLOVE ECLIPSE 7.0 STRL STRAW (GLOVE) ×2 IMPLANT
GLOVE EXAM NITRILE LRG STRL (GLOVE) IMPLANT
GLOVE EXAM NITRILE XL STR (GLOVE) IMPLANT
GLOVE EXAM NITRILE XS STR PU (GLOVE) IMPLANT
GOWN STRL REUS W/ TWL LRG LVL3 (GOWN DISPOSABLE) ×2 IMPLANT
GOWN STRL REUS W/ TWL XL LVL3 (GOWN DISPOSABLE) ×1 IMPLANT
GOWN STRL REUS W/TWL 2XL LVL3 (GOWN DISPOSABLE) IMPLANT
GOWN STRL REUS W/TWL LRG LVL3 (GOWN DISPOSABLE) ×2
GOWN STRL REUS W/TWL XL LVL3 (GOWN DISPOSABLE) ×1
HEMOSTAT POWDER KIT SURGIFOAM (HEMOSTASIS) ×2 IMPLANT
KIT BASIN OR (CUSTOM PROCEDURE TRAY) ×2 IMPLANT
KIT ROOM TURNOVER OR (KITS) ×2 IMPLANT
NEEDLE HYPO 25X1 1.5 SAFETY (NEEDLE) ×2 IMPLANT
NEEDLE SPNL 22GX3.5 QUINCKE BK (NEEDLE) ×2 IMPLANT
NS IRRIG 1000ML POUR BTL (IV SOLUTION) ×2 IMPLANT
OIL CARTRIDGE MAESTRO DRILL (MISCELLANEOUS) ×2
PACK LAMINECTOMY NEURO (CUSTOM PROCEDURE TRAY) ×2 IMPLANT
PAD ARMBOARD 7.5X6 YLW CONV (MISCELLANEOUS) ×6 IMPLANT
PUTTY GRAFTON 1CC (Putty) ×2 IMPLANT
RUBBERBAND STERILE (MISCELLANEOUS) ×4 IMPLANT
SCREW SELF DRILL 3.5X11MM PK2 (Screw) ×2 IMPLANT
SPONGE INTESTINAL PEANUT (DISPOSABLE) ×2 IMPLANT
SPONGE SURGIFOAM ABS GEL SZ50 (HEMOSTASIS) IMPLANT
STRIP CLOSURE SKIN 1/2X4 (GAUZE/BANDAGES/DRESSINGS) IMPLANT
SUT ETHILON 3 0 FSL (SUTURE) ×2 IMPLANT
SUT VIC AB 3-0 SH 8-18 (SUTURE) ×2 IMPLANT
SUT VICRYL 3-0 RB1 18 ABS (SUTURE) ×4 IMPLANT
TAPE CLOTH 3X10 TAN LF (GAUZE/BANDAGES/DRESSINGS) ×2 IMPLANT
TIP KERRISON THIN FOOTPLATE 1M (MISCELLANEOUS) ×2 IMPLANT
TIP KERRISON THIN FOOTPLATE 2M (MISCELLANEOUS) ×2 IMPLANT
TOWEL GREEN STERILE (TOWEL DISPOSABLE) ×2 IMPLANT
TOWEL GREEN STERILE FF (TOWEL DISPOSABLE) ×2 IMPLANT
WATER STERILE IRR 1000ML POUR (IV SOLUTION) ×2 IMPLANT

## 2017-11-11 NOTE — Op Note (Signed)
PREOP DIAGNOSIS:  Cervical disc disease with radiculopathy, C7-T1  POSTOP DIAGNOSIS: Same  PROCEDURE: 1. Discectomy at C7-T1 for decompression of spinal cord and exiting nerve roots  2. Placement of intervertebral biomechanical device Medtron59i[facility] L71940Rochester AmbulatoryAspire HealthOrthopAlLBet13hanFaxoFanInda CAr13kAsheville-Oteen Va MediGrac97iGastrointestinal Endoscopy Center L(83240Good SamFayetteOrthopAlSilver Springs SBet89hanSouth ShorFanInda COrthoatlanta Sur14gSummit Surgery Centere St MaGrac76iMethodist Hospit97040Encompass Health New England RehabiliatiOrthopAlGrove Hill Bet41hanEdgeleFanInda 19CCoquille Valley HospitaGrac74iMichael E. Debakey Va Medical Cent23440Va Greater Los Angeles HeMilan GenOrthopAlOgden RegionBet20hanPark 24VSelect Specialty Hospital Grac40iSabine Medical Cent50140Freeman Surgery Center OPost Acute Medical Specialty HospitalOrthopAlNorthampton Bet3hanOwens Cross RoadFanInda CValley H11eAultmaGrac30iSt Joseph'S Hospital & Health Cent(60340Group Health EaMid Ohio SOrthopAlNorthern Utah RehabiBet18hanBarneFanInda 85CBelmont Center For ComprehensiveGrac66iAscension Columbia St Marys Hospital Ozauk52040Iowa City Ambulatory SurgSelect Specialty Hospital - SpOrthopAlOne DBet37hanMontgomery Cree58FEast Bay Division - Martinez OutpatiGrac28iProvidence Kodiak Island Medical Cent(38540Bay EyesAlexandria Va MOrthopAlSurgery Center Of PBet57hanSanta Vene42tMedstar Southern Maryland HospiGrac51iChildren'S Hospital Colorado At St Josephs Ho82540Inland Valley SurgicRegency HospitaOrthopAlRenown South MeadoBet15hanSummer S1ePhysicians Surgery Center Grac39iFayette County Hospit(21840Csa SurgMeOrthopAlSsm Health St. Louis UnBet21hanEckhart MineFanInda CBaton Rouge Gener18aMainegeneral MediGrac63iAscension Macomb Oakland Hosp-Warren Camp30640The HaShore Ambulatory Surgical Center LLC Dba Jersey Shore Ambulatory SOrthopAlRaleigh EndoBet16hanWalbridgFanInd39aCare One AGrac81iKohala Hospit([phone] Brooks Va Medical CentIowa Specialty HosOrthopAlLake RegioBet74hanSutter CreeFanInda CLa Cas5aEast Los Angeles DoctorGrac78iTexan Surgery Cent50840Massachusetts GChristus Mother Frances HospitaOrthopAlSpotsylvania RegionBet36hanThe HammockFanInda43 Southwest Healthcare SysteGrac30iJefferson Regional Medical Cent840Avera Gregory HeRivendell Behavioral HeOrthopAlG. V. (Sonny) Montgomery Va MedicalBet57hanPecan PlantatioFa67nHopebridgGrac67iSt Margarets Hospit91640Hosp General MNorthside HospOrthopAlTuscan Surgery CentBet48hanRush ValleFa48nWilliam W BackuGrac66iFremont Hospit66940Essentia Health St MarysGeorgia Neurosurgical Institute Outpatient SOrthopAlOne DBet77hanChatfielF65aMercy Medical CenterGrac81iAmbulatory Surgery Center Of Centralia L72440Algonquin Road SurDelta MOrthopAlBirmingham Bet47hanIonFa44nExtended Care Of SouthwestGrac6iWaco Gastroenterology Endoscopy [phone] Medical Center - Jefferson BaPecos Valley Eye SurgeOrthopAlSelect Specialty HBet55hanFairlanFanInda CBucks County G43iFremont Ambulatory SurgeryGrac5iSanford Medical Center Far([phone]. Lukes SugaLee Regional MOrthopAlJesse Brown Va Medical Center - Va Chicago Bet90hanBylaFanInda C31CNew York-Presbyterian/LawrencGra50cBaptist Health Medical Center - Hot Spring Coun91740The Eye Surgical Center OfChristus Santa Rosa Hospital - OrthopAlMedicaBet32hanGrand BaFanInda CRe69nSun BehaviorGrac79iOak Circle Center - Mississippi State Hospit40640Los Angeles ComMeadows PsychOrthopAlBelton RegionBet19hanFayettevillFan24ISpecialty Surgical Center Of BeverlGrac52iWest Coast Joint And Spine Cent(76940Ophthalmology Center Of Brevard LP DbaEncompass Health Rehabilitation HosOrthopAlSumBet19hanFairvieFan95ILegacy Emanuel MediGrac71iSelect Specialty Hospital - North Knoxvil52040Outpatient PlasticSouthOrthopAlEncompass Health Rehabilitation HospiBet70hanNew ViennFan104IBaylor Orthopedic And Spine Hospital AtGrac30iDigestive Health Endoscopy Center L24840Lighthouse Lake Cumberland RegiOrthopAlEncompass Health Rehabilitation HoBet15hanMcGraFanInda CKaiser Fnd Hosp 47-Healthsouth Deaconess RehabilitatioGrac64iSt Vincent Hospit(34740Accel Rehabilitation HoKindred Hospital - San AnOrthopABet61hanOp11dPacific Cataract And Laser InsGrac9iHemet Healthcare Surgicenter I(20540Pankratz EyIntracare NOrthopAlCanonsburgBet52hanShenandoaFanInda CKindred53 St. ElizabetGrac34iWeimar Medical Cent(33640Navarro ReRenue SOrthopAlMadison Bet70hanUniontowF44aKaiser Fnd Hosp - ReGrac29iIntracare North Hospit640St Catherine MeWildcreek SOrthopAlMiami VallBet5hanCombinFanInda CC54hPalo Verde BehavioGrac7iPinnacle Hospit87240Mckenzie ReSpartanburg Medical Center - MaryOrthopAlCpgi EndBet70hanStanleFanInda60 Physicians Ambulatory Surgery Grac70iMount Sinai Hospital - Mount Sinai Hospital Of Quee([phone] CountyAshley MOrthopAlUpdegraff Vision Laser ABet58hanBrocktoFanIn89dFlint River CommunitGrac6iChapin Orthopedic Surgery Cent840Decatur (Atlanta) VaPrg OrthopAlMercy Walworth HospitalBet22hanMineraF74aVirginia Center For EGrac3iBrownsville Doctors Hospit(60540Alliance SurTrinity Surgery Center LLC Dba Baycare SOrthopAlWest Plains AmbulatoBet79hanGadsdeFanI40nNortheastern Vermont RegionaGraci47eGastroenterology Associates 640Cape RegionalOptions Behavioral OrthopAlNorth ShoreBet76hanClarkFanI16nMercy Rehabilitation Hospital SGrac68iOur Lady Of Pea74340Westside SurThe Center For OrthopedicOrthopAlParkwood BehavioBet24hanBeechwooFanInda CAll Cit101yExecutive Surgery Center Of LittlGrac32iTalbert Surgical Associat38040Select Specialty Hospital St. VincentOrthopAlTallahassee OutpatieBet43hanLimavillFa55nLake Mary Surgery Grac31iBlanchard Valley Hospit([phone])40Astra Regional Medical AndUnion MOrthopAlEndosBet40hanCambridgFanInda CAmbulatory Su10rFlorida StatGra20cPeninsula Endoscopy Center L([phone] BehavoOrthopAlNorthcreBet107hanCenter SandwicFanInda 57CLas Colinas Surgery Grac68iParkway Endoscopy Cent60340LakDublin SurgeOrthopAlDigestive HBet58hanWyominFanInda CNorth Florida Gi Center Db78aRock Prairie BehavioGrac61iFranciscan St Francis Health - Carm80340Jefferson Davis ComSt. LuOrthopAlKatherine ShaBet4hanHigh Brid75gSterlington RehabilitatioGrac66iDavis Eye Center I(46340IntermoThe Center For SpOrthopAlAdvanced AmbulatoryBet44hanNe47wRaritan Bay Medical Center - PGrac69iSouth Brooklyn Endoscopy [phone] Brooks Recovery Center - Resident Drug TrSurgical ElitOrthopAlGarden State Endoscopy ABet30hanMeekeFanIn61dLee MemoriaGrac31iMercy Rehabilitation Servic640Minnie Hamilton HeaConnecticut Surgery Center LimiteOrthopAlBay Area RegionBet36hanHastingFanInda CSgt. John L94.GrossmonGrac28iCitizens Medical Cent640St Cloud Ohio SurgeOrthopAlEye Surgical CentBet53hanAtmautluaFan82ICarris Health Redwood AreGrac34iLaird Hospit([phone] Park Surgery Center Of FOrthopAlAvera Gregory Bet54hanChuala57FTexoma MediGrac74iSouth Perry Endoscopy PL64040Southern RegionalPromise Hospital Of East Los Angeles-EasOrthopAlFargo Bet21hanBrid8gChristus Spohn HospGrac81iPotomac Valley Hospit30640Southern Tennessee Regional Health SyTOrthopAlCommunity HoBet82hanSharon CenteFanInda CC18aThe Vancouver Grac68iAvera Marshall Reg Med Cent240Lake'S Reception And Medical CeOrthopAlPresbyterian RuBet17hanWyno29nMendocino Coast DistricGrac7iBhc Fairfax Hospital Nor31040All City Family HealthFlorida MediOrthopAlHawaii MeBet67hanSouth AmanFanInd46aCoalinga Regional MediGrac17iColonial Outpatient Surgery Cent93040Miami VaGs Campus Asc Dba Lafayette SOrthopAlCox MedicBet66hanHoffmaFanInda 67CTheda Oaks Gastroenterology And Endoscopy Grac60iBay Pines Va Healthcare Syst93840Johnston Medical CentParkland Health CentOrthopAlFirelands Reg MedBet80hanToweFanInda CMoye Medical Endoscopy Center LLC Db24aGrove City Surgery Grac46iJones Regional Medical Cent60140Surgery Center Purcell MunicOrthopBet38hanPompton LakeF74aInland SurgeryGrac36iRed Cedar Surgery Center PL([phone] Ramon RegionalConnecticut Eye SurgeryOrthopAlSurgical CenterBet69hanNew UlFanIn44dEncompass Health Rehabilitation Hospital Grac32iRuston Regional Specialty Hospit(93940St Joseph'S Hospital AnTexas Institute For Surgery At Texas Health PresbyOrthopAlMarBet80hanSwea CitFanI69nSentara BaysidGrac46iSummit Endoscopy Cent81340Rochelle ComOregon Outpatient SOrthopAlLakewood RanBet54hanMilroFanInda CDo60cCurahealthGrac49iNhpe LLC Dba New Hyde Park Endosco36540Montgomery Thomas Johnson SOrthopAlMadison County Bet25hanGalesburFanInda CVict46oCleveland EmergencGrac85iSunrise Flamingo Surgery Center Limited Partnersh55140PlatinumThe University Of Vermont Health Network Elizabethtown Moses LudinOrthopAlMariBet56hanMaryland CitFanI46nTrinity Surgery Gra56cKessler Institute For Rehabilitati940Medical Center Of TrinityLake OrthopAlSentaBet50hanEast BenFanInda CVa Central Weste59rMidmichigan Medical CentGrac50iCincinnati Children'S Hospital Medical Center At Lindner Cent82840Munster SpecialtyEnloe Medical Center- EspOrthopAlAdveBet81hanCrestwooFanInda CC49oBaylor University MediGrac77iTmc Behavioral Health Cent93740KindreCentral Connecticut EndOrthoBet60hanHolFanI77nCarris Grac62iChildrens Healthcare Of Atlanta At Scottish Ri([phone] Tahoe Continuing OrthopAlSouth Jeffersonville MediBet31hanGrimeFanInda C74WBon Secours Rappahannock GeneraGrac41iSouth Shore Endoscopy Center I440Saint Joseph'S Regional Medical CeNew York Presbyterian Morgan Stanley ChildrOrthopAlSummit SuBet37hanPiper CitFanInda CGood S38hUpstate Surgery Grac42iSaint Thomas Rutherford Hospit51340Wills Surgical CenterSouthwestern Regional MOrthopAlPrince Frederick SBet54hanUtuadFan27IAdventist Rehabilitation Hospital OGrac64iQuillen Rehabilitation Hospit73740St Simons By-TSt Anthonys MemoOrthopAlBurgess Bet57hanConcretFanInda CEndoscopy32 Providence Valdez MediGrac45iSanta Barbara Endoscopy Center L31740Emerald Coast SuLincoln Surgery EndoscopyOrthopAlKindred HospitBet47han23MCumberland MediGrac27iAbrom Kaplan Memorial Hospit70440Lhz Ltd Dba St ClareDutchess Ambulatory SuOrthopAlMimbres Bet51hanHarwich CenteFan52IOak ForesGrac72iPerimeter Surgical Cent31940Riverwoods SurWarren GenOrthopAlForrest CiBet74hanParker SchooFanInda CS10eNorth Coast EndGrac90iPalmetto Endoscopy Center L41040John C Stennis MeHuebner Ambulatory SurgeOrthopAlSt Joseph County Va HBet68hanWeskaFanInda CGastrodiagnostics A Medical Group Db31aBolivar GeneraGraciela Husbandsgery Center OrangTye([phone]-MarylaYorkMerri Brunet anterior interbody technique  5. Use of intraoperative microscope  SURGEON: Dr. Doreatha Offer, MD  ASSISTANT: Dr. Jeffrey Jenkins, MD  ANESTHESIA: General Endotracheal  EBL: 50cc  SPECIMENS: None  DRAINS: None  COMPLICATIONS: None immediate  CONDITION: Hemodynamically stable to PACU  HISTORY: Tabitha Mcdonald is a 61 y.o. y.o. female who initially presented to the outpatient clinic with signs and symptoms consistent with cervical radiculopathy.  She had previously undergone ACDF at C5-6 and C6-7 more than 10 years ago.  She obtained transient relief with epidural steroid injections.. MRI demonstrated foraminal stenosis bilaterally at C7-T1 adjacent to the prior construct.. She elected to proceed with surgical decompression and fusion at C7-T1. After all questions were answered, informed consent was obtained.  PROCEDURE IN DETAIL: The patient was brought to the operating room and transferred to the operative table. After induction of general anesthesia, the patient was positioned on the operative table in the supine position with all pressure points meticulously padded. The skin of the neck was then prepped and draped in the usual sterile fashion.  After timeout was conducted, the previous skin was infiltrated with local anesthetic. Skin incision was then made sharply and Bovie electrocautery was used to dissect the subcutaneous tissue until the platysma was identified. The platysma was then divided and undermined. The sternocleidomastoid muscle was then identified and, utilizing natural fascial planes in the neck, the prevertebral fascia  was identified and the carotid sheath was retracted laterally and the trachea and esophagus retracted medially.  I identified the inferior aspect of the previously placed plate overlying the body of C7.  The inferior portion of the plate appeared to be encased in bone.  Identified what appeared to be the C7-T1 interspace.  Needle was placed and intraoperative fluoroscopy confirmed our location at the correct level.  The disc space was almost completely collapsed.  The high-speed drill was used to drill the disc space including a portion of the inferior endplate of C7 and the superior portion of T1 in order to obtain adequate working space.  The posterior longitudinal ligament was then identified.  Using a nerve hook, the PLL was elevated, and Kerrison rongeurs were used to remove the posterior longitudinal ligament and the ventral thecal sac was identified. Using a combination of curettes and rongeurs, complete decompression of the thecal sac and exiting nerve roots at this level was completed, and verified using micro-nerve hook.   Having completed our decompression, attention was turned to placement of the intervertebral device. Trial spacers were used to select a 6 mm graft. This graft was then filled with morcellized allograft, and inserted under live fluoroscopy.  11 mm screw was then inserted with an angled inserter through the anterior portion of the C7 endplate.  Similarly, an inferiorly directed screw was placed through the anterior aspect of the T1 endplate.  Final fluoroscopic images in AP and lateral projections were taken to confirm good hardware placement.  At this point, after all counts were verified to be correct, meticulous hemostasis was secured using a combination of bipolar electrocautery and passive hemostatics. The platysma muscle was then closed using interrupted 3-0 Vicryl sutures, and the skin was closed with an interrupted 3-0 Vicry subcuticular stitch.  Dermabond and sterile  dressings were then applied and the drapes removed.  The patient tolerated the procedure well and was extubated in the room and taken to the postanesthesia care unit in stable condition.

## 2017-11-11 NOTE — Anesthesia Preprocedure Evaluation (Signed)
Anesthesia Evaluation  Patient identified by MRN, date of birth, ID band Patient awake    Reviewed: Allergy & Precautions, NPO status , Patient's Chart, lab work & pertinent test results  Airway Mallampati: II  TM Distance: >3 FB Neck ROM: Limited    Dental no notable dental hx.    Pulmonary neg pulmonary ROS,    Pulmonary exam normal breath sounds clear to auscultation       Cardiovascular negative cardio ROS Normal cardiovascular exam Rhythm:Regular Rate:Normal     Neuro/Psych negative neurological ROS  negative psych ROS   GI/Hepatic Neg liver ROS, GERD  ,  Endo/Other  negative endocrine ROS  Renal/GU negative Renal ROS  negative genitourinary   Musculoskeletal negative musculoskeletal ROS (+)   Abdominal   Peds negative pediatric ROS (+)  Hematology negative hematology ROS (+)   Anesthesia Other Findings   Reproductive/Obstetrics negative OB ROS                             Anesthesia Physical Anesthesia Plan  ASA: II  Anesthesia Plan: General   Post-op Pain Management:    Induction: Intravenous  PONV Risk Score and Plan: 3 and Ondansetron, Dexamethasone, Treatment may vary due to age or medical condition and Midazolam  Airway Management Planned: Oral ETT and Video Laryngoscope Planned  Additional Equipment:   Intra-op Plan:   Post-operative Plan: Extubation in OR  Informed Consent: I have reviewed the patients History and Physical, chart, labs and discussed the procedure including the risks, benefits and alternatives for the proposed anesthesia with the patient or authorized representative who has indicated his/her understanding and acceptance.   Dental advisory given  Plan Discussed with: CRNA and Surgeon  Anesthesia Plan Comments:         Anesthesia Quick Evaluation

## 2017-11-11 NOTE — H&P (Signed)
Chief Complaint  Neck and arm pain  History of Present Illness  [name] is a 61 y.o. woman with neck and arm pain seen in the outpatient clinic. We have been managing right-sided neck and arm pain conservatively. She underwent C5-6 C6-7 ACDF many years ago. More recent MRI has demonstrated foraminal stenosis at C7-T1. She has tried a series of 2 epidural steroid injections which unfortunately only provided short-term relief of her symptoms. She wants to proceed with surgery.  Past Medical History   Past Medical History:  Diagnosis Date  . Arthritis   . Asthma    seasonal  . Back pain   . Cervical disc disorder with radiculopathy 01/21/2015  . Complication of anesthesia    needs Glidescope for intubation as had cervical neck surgery  . GERD (gastroesophageal reflux disease)   . History of hiatal hernia    small hernia  . Insomnia   . Pneumonia     Past Surgical History   Past Surgical History:  Procedure Laterality Date  . ABDOMINAL HYSTERECTOMY    . APPENDECTOMY    . BACK SURGERY    . CERVICAL SPINE SURGERY     C5-C6  . CESAREAN SECTION    . CHOLECYSTECTOMY N/A 10/18/2017   Procedure: LAPAROSCOPIC CHOLECYSTECTOMY;  Surgeon: Berna Bueonnor, Chelsea A, MD;  Location: [facility];  Service: General;  Laterality: N/A;  . COLONOSCOPY    . DILATION AND CURETTAGE OF UTERUS    . TONSILLECTOMY      Social History   Social History   Tobacco Use  . Smoking status: Never Smoker  . Smokeless tobacco: Never Used  Substance Use Topics  . Alcohol use: No    Alcohol/week: 0.0 oz  . Drug use: No    Medications   Prior to Admission medications   Medication Sig Start Date End Date Taking? Authorizing Provider  docusate sodium (COLACE) 100 MG capsule Take 1 capsule (100 mg total) 2 (two) times daily by mouth. 10/18/17 11/17/17 Yes Berna Bueonnor, Chelsea A, MD  estradiol (VIVELLE-DOT) 0.025 MG/24HR Place 1 patch onto the skin 2 (two) times a week. 10/18/16  Yes Dove, Myra C, MD    oxyCODONE-acetaminophen (PERCOCET/ROXICET) 5-325 MG tablet Take 1 tablet every 6 (six) hours as needed by mouth for severe pain. 10/18/17  Yes Berna Bueonnor, Chelsea A, MD  pantoprazole (PROTONIX) 40 MG tablet Take 40 mg by mouth daily. 07/08/17  Yes [provider]  traMADol (ULTRAM) 50 MG tablet Take 1 tablet (50 mg total) by mouth every 12 (twelve) hours as needed for moderate pain or severe pain. 11/08/17  Yes Phelps, Erin O, PA-C  zolpidem (AMBIEN) 10 MG tablet Take 0.5 tablets (5 mg total) by mouth at bedtime. Patient taking differently: Take 10 mg by mouth at bedtime.  10/18/16  Yes Dove, Myra C, MD  albuterol (PROVENTIL HFA;VENTOLIN HFA) 108 (90 Base) MCG/ACT inhaler Inhale 2 puffs into the lungs every 6 (six) hours as needed for wheezing. 01/27/17   Sunnie NielsenAlexander, Natalie, DO  Cholecalciferol (VITAMIN D3 PO) Take 1 capsule by mouth daily.    [provider]  fexofenadine (ALLEGRA) 180 MG tablet Take 180 mg by mouth daily as needed.     [provider]    Allergies   Allergies  Allergen Reactions  . Amoxicillin Nausea And Vomiting and Other (See Comments)    Causes upset stomach, worsening Acid reflux  . Zofran [Ondansetron Hcl] Other (See Comments)    Gives patient migraines  . Ibuprofen Nausea And Vomiting  .  Oxycodone Nausea And Vomiting    Review of Systems  ROS  Neurologic Exam  Awake, alert, oriented Memory and concentration grossly intact Speech fluent, appropriate CN grossly intact Motor exam: Upper Extremities Deltoid Bicep Tricep Grip  Right 5/5 5/5 5/5 5/5  Left 5/5 5/5 5/5 5/5   Lower Extremities IP Quad PF DF EHL  Right 5/5 5/5 5/5 5/5 5/5  Left 5/5 5/5 5/5 5/5 5/5   Sensation grossly intact to LT  Imaging  Previous CT scan dated August 2016 was again reviewed. This demonstrates solid interbody fusion at C5-6 and C6-7.    Impression  61 year old woman with right-sided cervical radiculopathy related to adjacent level disease at C7-T1  after previous C5-C7 ACDF which has fused well. She has failed reasonable conservative treatments.  Plan  we will plan on proceeding with ACDF at C7-T1, with possible need for plate removal from C5-C7.  I reviewed the procedure and risks in detail with the patient in the office. The patient has had previous ACDF and is familiar with the risks of the surgery and the expected postoperative course. The risks of surgery were discussed in detail with the patient which include but are not limited to spinal cord injury which may result in hand, leg, and bowel dysfunction, postoperative dysphagia, dysphonia, neck hematoma, or subsequent surgery for epidural hematoma. The risk of CSF leak was also discussed. In addition, I explained to him that after spinal fusion surgery, there is a risk of adjacent level disease requiring future surgical intervention. The possibility of continued/worsening pain after surgery was also discussed. The general risks of anesthesia were also reviewed including heart attack, stroke, and DVT/PE.   The patient understood our discussion as well as the risks of the surgery and is willing to proceed. All questions were answered.

## 2017-11-11 NOTE — Anesthesia Procedure Notes (Addendum)
Procedure Name: Intubation Date/Time: 11/11/2017 2:43 PM Performed by: Jed LimerickHarder, Thailyn Khalid S, CRNA Pre-anesthesia Checklist: Patient identified, Emergency Drugs available, Suction available and Patient being monitored Patient Re-evaluated:Patient Re-evaluated prior to induction Oxygen Delivery Method: Circle System Utilized Preoxygenation: Pre-oxygenation with 100% oxygen Induction Type: IV induction Ventilation: Oral airway inserted - appropriate to patient size and Mask ventilation with difficulty Laryngoscope Size: 3 and Glidescope Grade View: Grade I Tube type: Oral Tube size: 7.0 mm Number of attempts: 1 Airway Equipment and Method: Stylet,  Oral airway and Video-laryngoscopy Placement Confirmation: ETT inserted through vocal cords under direct vision,  positive ETCO2 and breath sounds checked- equal and bilateral Secured at: 21 cm Tube secured with: Tape Dental Injury: Teeth and Oropharynx as per pre-operative assessment  Difficulty Due To: Difficult Airway- due to reduced neck mobility Comments: Intubation by Clare GandyHunter Botts, SRNA

## 2017-11-11 NOTE — Transfer of Care (Signed)
Immediate Anesthesia Transfer of Care Note  Patient: Tabitha AblesMichele A Mcdonald  Procedure(s) Performed: ANTERIOR CERVICAL DECOMPRESSION/DISCECTOMY FUSION CERVICAL SEVEN - THORACIC ONE (N/A Neck)  Patient Location: PACU  Anesthesia Type:General  Level of Consciousness: awake and patient cooperative  Airway & Oxygen Therapy: Patient Spontanous Breathing and Patient connected to face mask oxygen  Post-op Assessment: Report given to RN and Post -op Vital signs reviewed and stable  Post vital signs: Reviewed and stable  Last Vitals:  Vitals:   11/11/17 1128  BP: (!) 145/90  Pulse: 76  Resp: 20  Temp: 36.5 C  SpO2: 99%    Last Pain:  Vitals:   11/11/17 1153  TempSrc:   PainSc: 7          Complications: No apparent anesthesia complications

## 2017-11-12 DIAGNOSIS — M5093 Cervical disc disorder, unspecified, cervicothoracic region: Secondary | ICD-10-CM | POA: Diagnosis not present

## 2017-11-12 MED ORDER — METHOCARBAMOL 500 MG PO TABS: 500.0000 mg | ORAL_TABLET | Freq: Four times a day (QID) | ORAL | 0 refills | Status: AC | PRN

## 2017-11-12 MED ORDER — HYDROCODONE-ACETAMINOPHEN 10-325 MG PO TABS: 1.0000 | ORAL_TABLET | ORAL | 0 refills | Status: AC | PRN

## 2017-11-12 NOTE — Discharge Instructions (Signed)
Wound Care  You may remove outer bandage after 2 days and shower.  Keep incision open to air. Do not put any creams, lotions, or ointments on incision.  Activity Walk each and every day, increasing distance each day. No lifting greater than 5 lbs.  Avoid excessive neck motion. No driving for 2 weeks; may ride as a passenger locally. Diet Resume your normal diet.  Return to Work Will be discussed at you follow up appointment. Call Your Doctor If Any of These Occur Redness, drainage, or swelling at the wound.  Temperature greater than 101 degrees. Severe pain not relieved by pain medication. Increased difficulty swallowing.  Incision starts to come apart. Follow Up Appt Call today and ask for appointment in 1-2 weeks ([phone]) or for problems.  If you have any hardware placed in your spine, you will need an x-ray before your appointment.  

## 2017-11-12 NOTE — Evaluation (Signed)
Occupational Therapy Evaluation and Discharge Patient Details Name: Tabitha Mcdonald MRN: [id number] DOB: 1956/08/28 Today's Date: 11/12/2017    History of Present Illness Pt is a 61 y/o female s/p C7-T1 discectomy. PMH including but not limited to asthma and prior cervical surgery.   Clinical Impression   PTA Pt independent in ADL and mobility. Pt is currently mod I for ADL and fully educated on precautions and compensatory strategies for cervical sx. Pt and caregiver (Uncle) verbalized and demonstrated understanding. Education complete. Thank you for the opportunity to serve this patient.    Follow Up Recommendations  DC plan and follow up therapy as arranged by surgeon    Equipment Recommendations  None recommended by OT    Recommendations for Other Services       Precautions / Restrictions Precautions Precautions: Cervical Precaution Comments: PT had provided cervical handout and reviewed with pt and caregiver Required Braces or Orthoses: (no brace needed) Restrictions Weight Bearing Restrictions: No      Mobility Bed Mobility Overal bed mobility: Needs Assistance Bed Mobility: Rolling;Sidelying to Sit Rolling: Supervision Sidelying to sit: Supervision       General bed mobility comments: Pt sitting EOB when OT entered room  Transfers Overall transfer level: Needs assistance Equipment used: Rolling walker (2 wheeled) Transfers: Sit to/from Stand Sit to Stand: Supervision         General transfer comment: for safety    Balance Overall balance assessment: Needs assistance Sitting-balance support: Feet supported Sitting balance-Leahy Scale: Good     Standing balance support: During functional activity Standing balance-Leahy Scale: Fair                             ADL either performed or assessed with clinical judgement   ADL Overall ADL's : Modified independent                                       General ADL Comments:  Pt edcuated on compensatory strategies for ADL around bathing, dressing, IADL like cooking - verbally reviewed AE for LB Dressing and uncle is going to let her borrow his. Pt was able to dress herself, and can bring feet cross to knees.      Vision Patient Visual Report: No change from baseline       Perception     Praxis      Pertinent Vitals/Pain Pain Assessment: Faces Faces Pain Scale: Hurts little more Pain Location: neck/throat Pain Descriptors / Indicators: Sore Pain Intervention(s): Monitored during session     Hand Dominance Right   Extremity/Trunk Assessment Upper Extremity Assessment Upper Extremity Assessment: Overall WFL for tasks assessed   Lower Extremity Assessment Lower Extremity Assessment: Overall WFL for tasks assessed   Cervical / Trunk Assessment Cervical / Trunk Assessment: Other exceptions Cervical / Trunk Exceptions: s/p cervical sx   Communication Communication Communication: No difficulties   Cognition Arousal/Alertness: Awake/alert Behavior During Therapy: WFL for tasks assessed/performed Overall Cognitive Status: Within Functional Limits for tasks assessed                                     General Comments  Pt's uncle present during session    Exercises     Shoulder Instructions      Home Living Family/patient expects to  be discharged to:: Private residence Living Arrangements: Spouse/significant other Available Help at Discharge: Family;Available 24 hours/day   Home Access: Stairs to enter [employer] CorporationEntrance Stairs-Number of Steps: 3 flights Entrance Stairs-Rails: Right;Left Home Layout: One level     Bathroom Shower/Tub: Chief Strategy OfficerTub/shower unit   Bathroom Toilet: Standard     Home Equipment: Cane - single point          Prior Functioning/Environment Level of Independence: Independent with assistive device(s)        Comments: pt recently ambulating with use of SPC secondary to R toe fx        OT Problem List:  Decreased range of motion;Impaired balance (sitting and/or standing);Decreased safety awareness;Decreased knowledge of use of DME or AE;Decreased knowledge of precautions;Pain      OT Treatment/Interventions:      OT Goals(Current goals can be found in the care plan section) Acute Rehab OT Goals Patient Stated Goal: return home today OT Goal Formulation: With patient Time For Goal Achievement: 11/26/17 Potential to Achieve Goals: Good  OT Frequency:     Barriers to D/C:            Co-evaluation              AM-PAC PT "6 Clicks" Daily Activity     Outcome Measure Help from another person eating meals?: None Help from another person taking care of personal grooming?: None Help from another person toileting, which includes using toliet, bedpan, or urinal?: None Help from another person bathing (including washing, rinsing, drying)?: A Little Help from another person to put on and taking off regular upper body clothing?: None Help from another person to put on and taking off regular lower body clothing?: A Little 6 Click Score: 22   End of Session Nurse Communication: Mobility status  Activity Tolerance: Patient tolerated treatment well Patient left: in bed;with call bell/phone within reach;with family/visitor present(EOB)  OT Visit Diagnosis: Pain;Unsteadiness on feet (R26.81) Pain - part of body: (neck)                Time: [id number] OT Time Calculation (min): 14 min Charges:  OT General Charges $OT Visit: 1 Visit OT Evaluation $OT Eval Low Complexity: 1 Low G-Codes: OT G-codes **NOT FOR INPATIENT CLASS** Functional Assessment Tool Used: AM-PAC 6 Clicks Daily Activity Functional Limitation: Self care Self Care Current Status ([id number](G8987): At least 20 percent but less than 40 percent impaired, limited or restricted Self Care Goal Status (J1914(G8988): 0 percent impaired, limited or restricted Self Care Discharge Status [phone](G8989): At least 20 percent but less than 40 percent  impaired, limited or restricted   Sherryl MangesLaura Lamiyah Schlotter OTR/L [phone]  Tabitha Mcdonald 11/12/2017, 12:51 PM

## 2017-11-12 NOTE — Evaluation (Signed)
Physical Therapy Evaluation & Discharge Patient Details Name: Tabitha AblesMichele A Mcdonald MRN: [id number] DOB: 09-04-1956 Today's Date: 11/12/2017   History of Present Illness  Pt is a 61 y/o female s/p C7-T1 discectomy. PMH including but not limited to asthma and prior cervical surgery.  Clinical Impression  Pt presented supine in bed with HOB elevated, awake and willing to participate in therapy session. Prior to admission, pt reported that she was independent with ADLs and had recently been using a SPC to ambulate with secondary to a R toe fracture. Pt will have family available 24/7 to assist her as needed. PT provided pt with cervical handout and reviewed precautions. Pt ambulated in hallway with use of RW for support. Pt also successfully completed stair training this session with no concerns. No further acute PT needs identified at this time. PT signing off.     Follow Up Recommendations No PT follow up;Supervision - Intermittent    Equipment Recommendations  Rolling walker with 5" wheels    Recommendations for Other Services       Precautions / Restrictions Precautions Precautions: Cervical Precaution Comments: PT provided cervical handout and reviewed with pt Required Braces or Orthoses: (no brace needed) Restrictions Weight Bearing Restrictions: No      Mobility  Bed Mobility Overal bed mobility: Needs Assistance Bed Mobility: Rolling;Sidelying to Sit Rolling: Supervision Sidelying to sit: Supervision       General bed mobility comments: cueing for use of log roll technique, supervision for safety  Transfers Overall transfer level: Needs assistance Equipment used: Rolling walker (2 wheeled) Transfers: Sit to/from Stand Sit to Stand: Supervision         General transfer comment: for safety  Ambulation/Gait Ambulation/Gait assistance: Supervision Ambulation Distance (Feet): 400 Feet Assistive device: Rolling walker (2 wheeled) Gait Pattern/deviations: Step-through  pattern;Decreased stride length Gait velocity: decreased Gait velocity interpretation: Below normal speed for age/gender General Gait Details: mild instability but no overt LOB or need for physical assistance, supervision for safety with use of RW  Stairs Stairs: Yes Stairs assistance: Min guard Stair Management: Two rails;Step to pattern;Forwards Number of Stairs: 10 General stair comments: no instability or LOB, min guard for safety  Wheelchair Mobility    Modified Rankin (Stroke Patients Only)       Balance Overall balance assessment: Needs assistance Sitting-balance support: Feet supported Sitting balance-Leahy Scale: Good     Standing balance support: During functional activity Standing balance-Leahy Scale: Fair                               Pertinent Vitals/Pain Pain Assessment: Faces Faces Pain Scale: Hurts little more Pain Location: neck/throat Pain Descriptors / Indicators: Sore Pain Intervention(s): Monitored during session;Repositioned    Home Living Family/patient expects to be discharged to:: Private residence Living Arrangements: Spouse/significant other Available Help at Discharge: Family;Available 24 hours/day   Home Access: Stairs to enter Entrance Stairs-Rails: Right;Left Entrance Stairs-Number of Steps: 3 flights Home Layout: One level Home Equipment: Cane - single point      Prior Function Level of Independence: Independent with assistive device(s)         Comments: pt recently ambulating with use of SPC secondary to R toe fx     Hand Dominance        Extremity/Trunk Assessment   Upper Extremity Assessment Upper Extremity Assessment: Defer to OT evaluation    Lower Extremity Assessment Lower Extremity Assessment: Overall WFL for tasks assessed  Cervical / Trunk Assessment Cervical / Trunk Assessment: Other exceptions Cervical / Trunk Exceptions: s/p cervical sx  Communication   Communication: No difficulties   Cognition Arousal/Alertness: Awake/alert Behavior During Therapy: WFL for tasks assessed/performed Overall Cognitive Status: Within Functional Limits for tasks assessed                                        General Comments      Exercises     Assessment/Plan    PT Assessment Patent does not need any further PT services  PT Problem List         PT Treatment Interventions      PT Goals (Current goals can be found in the Care Plan section)  Acute Rehab PT Goals Patient Stated Goal: return home today    Frequency     Barriers to discharge        Co-evaluation               AM-PAC PT "6 Clicks" Daily Activity  Outcome Measure Difficulty turning over in bed (including adjusting bedclothes, sheets and blankets)?: A Little Difficulty moving from lying on back to sitting on the side of the bed? : A Little Difficulty sitting down on and standing up from a chair with arms (e.g., wheelchair, bedside commode, etc,.)?: A Little Help needed moving to and from a bed to chair (including a wheelchair)?: None Help needed walking in hospital room?: A Little Help needed climbing 3-5 steps with a railing? : A Little 6 Click Score: 19    End of Session Equipment Utilized During Treatment: Gait belt Activity Tolerance: Patient tolerated treatment well Patient left: with call bell/phone within reach;with family/visitor present;Other (comment)(in bathroom) Nurse Communication: Mobility status PT Visit Diagnosis: Other abnormalities of gait and mobility (R26.89);Pain Pain - part of body: (neck)    Time: [id number] PT Time Calculation (min) (ACUTE ONLY): 21 min   Charges:   PT Evaluation $PT Eval Moderate Complexity: 1 Mod     PT G Codes:        RosemountJennifer Lael Mcdonald, PT, DPT [phone]   Tabitha BevelsJennifer M Marvell Mcdonald 11/12/2017, 9:07 AM

## 2017-11-12 NOTE — Progress Notes (Signed)
Patient is discharged from room 3C11 at this time. Alert and in stable condition. IV site d/c'd and instructions read to aptient and family with understanding verbalized. Left unit via wheelchair with all belongings at side.

## 2017-11-12 NOTE — Plan of Care (Signed)
  Progressing Safety: Ability to remain free from injury will improve 11/12/2017 0548 - Progressing by Wille CelesteMadriaga-Acosta, Leontine Radman D, RN Activity: Will remain free from falls 11/12/2017 0548 - Progressing by Wille CelesteMadriaga-Acosta, Davine Coba D, RN Education: Ability to verbalize activity precautions or restrictions will improve 11/12/2017 0548 - Progressing by Wille CelesteMadriaga-Acosta, Elishia Kaczorowski D, RN Knowledge of the prescribed therapeutic regimen will improve 11/12/2017 0548 - Progressing by Wille CelesteMadriaga-Acosta, Falen Lehrmann D, RN Understanding of discharge needs will improve 11/12/2017 0548 - Progressing by Wille CelesteMadriaga-Acosta, Elbia Paro D, RN Physical Regulation: Ability to maintain clinical measurements within normal limits will improve 11/12/2017 0548 - Progressing by Wille CelesteMadriaga-Acosta, Leibish Mcgregor D, RN Postoperative complications will be avoided or minimized 11/12/2017 0548 - Progressing by Wille CelesteMadriaga-Acosta, Luvinia Lucy D, RN Diagnostic test results will improve 11/12/2017 0548 - Progressing by Wille CelesteMadriaga-Acosta, Ranson Belluomini D, RN Skin Integrity: Signs of wound healing will improve 11/12/2017 0548 - Progressing by Wille CelesteMadriaga-Acosta, Gaddiel Cullens D, RN Health Behavior/Discharge Planning: Identification of resources available to assist in meeting health care needs will improve 11/12/2017 0548 - Progressing by Wille CelesteMadriaga-Acosta, Rebecah Dangerfield D, RN

## 2017-11-12 NOTE — Discharge Summary (Signed)
Physician Discharge Summary  Patient ID: Tabitha AblesMichele A Patalano MRN: [id number] DOB/AGE: 59-Jan-1957 59 y.o.  Admit date: 11/11/2017 Discharge date: 11/12/2017  Admission Diagnoses: C7-T1 disc degeneration, spondylosis, cervicalgia, cervical radiculopathy  Discharge Diagnoses: The same Active Problems:   Cervical disc disorder of cervicothoracic region   Discharged Condition: good  Hospital Course: Dr. Conchita ParisNundkumar performed a C7-T1 anterior cervical discectomy, fusion and instrumentation on the patient on 11/11/2017.  The patient's postoperative course was unremarkable.  On postoperative day #1 she felt well and requested discharge to home.  She was given written and oral discharge instructions.  All her questions were answered.  She requested a walker for stability as she fell last week.  Consults: Physical therapy Significant Diagnostic Studies: None Treatments: C7-T1 anterior cervical discectomy, fusion and instrumentation Discharge Exam: Blood pressure 97/61, pulse 80, temperature 98.2 F (36.8 C), temperature source Oral, resp. rate 18, height 5\' 4"  (1.626 m), weight 83 kg (183 lb), SpO2 95 %. The patient is alert and pleasant.  She looks well.  Her dressing is clean dry.  There is no hematoma or shift.  She is moving all 4 extremities well.  Disposition: Home  Discharge Instructions    Call MD for:  difficulty breathing, headache or visual disturbances   Complete by:  As directed    Call MD for:  extreme fatigue   Complete by:  As directed    Call MD for:  hives   Complete by:  As directed    Call MD for:  persistant dizziness or light-headedness   Complete by:  As directed    Call MD for:  persistant nausea and vomiting   Complete by:  As directed    Call MD for:  redness, tenderness, or signs of infection (pain, swelling, redness, odor or green/yellow discharge around incision site)   Complete by:  As directed    Call MD for:  severe uncontrolled pain   Complete by:  As  directed    Call MD for:  temperature >100.4   Complete by:  As directed    Diet - low sodium heart healthy   Complete by:  As directed    Discharge instructions   Complete by:  As directed    Call [phone] for a followup appointment. Take a stool softener while you are using pain medications.   Driving Restrictions   Complete by:  As directed    Do not drive for 2 weeks.   Increase activity slowly   Complete by:  As directed    Lifting restrictions   Complete by:  As directed    Do not lift more than 5 pounds. No excessive bending or twisting.   May shower / Bathe   Complete by:  As directed    He may shower after the pain she is removed 3 days after surgery. Leave the incision alone.   Remove dressing in 48 hours   Complete by:  As directed    Your stitches are under the scan and will dissolve by themselves. The Steri-Strips will fall off after you take a few showers. Do not rub back or pick at the wound, Leave the wound alone.     Allergies as of 11/12/2017      Reactions   Amoxicillin Nausea And Vomiting, Other (See Comments)   Causes upset stomach, worsening Acid reflux   Zofran [ondansetron Hcl] Other (See Comments)   Gives patient migraines   Ibuprofen Nausea And Vomiting   Oxycodone Nausea And Vomiting  Medication List    STOP taking these medications   oxyCODONE-acetaminophen 5-325 MG tablet Commonly known as:  PERCOCET/ROXICET   traMADol 50 MG tablet Commonly known as:  ULTRAM   zolpidem 10 MG tablet Commonly known as:  AMBIEN     TAKE these medications   albuterol 108 (90 Base) MCG/ACT inhaler Commonly known as:  PROVENTIL HFA;VENTOLIN HFA Inhale 2 puffs into the lungs every 6 (six) hours as needed for wheezing.   docusate sodium 100 MG capsule Commonly known as:  COLACE Take 1 capsule (100 mg total) 2 (two) times daily by mouth.   estradiol 0.025 MG/24HR Commonly known as:  VIVELLE-DOT Place 1 patch onto the skin 2 (two) times a week.    fexofenadine 180 MG tablet Commonly known as:  ALLEGRA Take 180 mg by mouth daily as needed.   HYDROcodone-acetaminophen 10-325 MG tablet Commonly known as:  NORCO Take 1-2 tablets by mouth every 4 (four) hours as needed for severe pain ((score 7 to 10)).   methocarbamol 500 MG tablet Commonly known as:  ROBAXIN Take 1 tablet (500 mg total) by mouth every 6 (six) hours as needed for muscle spasms.   pantoprazole 40 MG tablet Commonly known as:  PROTONIX Take 40 mg by mouth daily.   VITAMIN D3 PO Take 1 capsule by mouth daily.        Signed: Cristi LoronJeffrey D Amerah Puleo 11/12/2017, 8:32 AM

## 2017-11-14 NOTE — Anesthesia Postprocedure Evaluation (Signed)
Anesthesia Post Note  Patient: Tabitha Mcdonald  Procedure(s) Performed: ANTERIOR CERVICAL DECOMPRESSION/DISCECTOMY FUSION CERVICAL SEVEN - THORACIC ONE (N/A Neck)     Patient location during evaluation: PACU Anesthesia Type: General Level of consciousness: awake and alert Pain management: pain level controlled Vital Signs Assessment: post-procedure vital signs reviewed and stable Respiratory status: spontaneous breathing, nonlabored ventilation, respiratory function stable and patient connected to nasal cannula oxygen Cardiovascular status: blood pressure returned to baseline and stable Postop Assessment: no apparent nausea or vomiting Anesthetic complications: no    Last Vitals:  Vitals:   11/12/17 0402 11/12/17 0728  BP: 100/71 97/61  Pulse: 94 80  Resp: 20 18  Temp: 36.9 C 36.8 C  SpO2: 97% 95%    Last Pain:  Vitals:   11/12/17 0925  TempSrc:   PainSc: 6                  Harli Engelken S

## 2017-11-15 ENCOUNTER — Encounter (HOSPITAL_COMMUNITY): Payer: Self-pay | Admitting: Neurosurgery

## 2017-12-29 ENCOUNTER — Other Ambulatory Visit: Payer: Self-pay | Admitting: *Deleted

## 2017-12-29 NOTE — Telephone Encounter (Signed)
[facility].  Request was denied as patient has not been seen since 11/17.  Once pt makes appt will give her RF until appt.

## 2018-02-08 ENCOUNTER — Encounter: Payer: Self-pay | Admitting: Osteopathic Medicine

## 2018-02-15 ENCOUNTER — Ambulatory Visit: Payer: BLUE CROSS/BLUE SHIELD | Admitting: Physical Therapy

## 2018-02-16 ENCOUNTER — Ambulatory Visit (INDEPENDENT_AMBULATORY_CARE_PROVIDER_SITE_OTHER): Payer: BLUE CROSS/BLUE SHIELD | Admitting: Osteopathic Medicine

## 2018-02-16 ENCOUNTER — Encounter: Payer: Self-pay | Admitting: Osteopathic Medicine

## 2018-02-16 VITALS — BP 157/100 | HR 67 | Temp 98.0°F | Wt 188.1 lb

## 2018-02-16 DIAGNOSIS — R030 Elevated blood-pressure reading, without diagnosis of hypertension: Secondary | ICD-10-CM | POA: Diagnosis not present

## 2018-02-16 DIAGNOSIS — Z0000 Encounter for general adult medical examination without abnormal findings: Secondary | ICD-10-CM

## 2018-02-16 DIAGNOSIS — Z113 Encounter for screening for infections with a predominantly sexual mode of transmission: Secondary | ICD-10-CM | POA: Diagnosis not present

## 2018-02-16 DIAGNOSIS — R309 Painful micturition, unspecified: Secondary | ICD-10-CM | POA: Diagnosis not present

## 2018-02-16 MED ORDER — FEXOFENADINE HCL 180 MG PO TABS: 180.0000 mg | ORAL_TABLET | Freq: Every day | ORAL | 3 refills | Status: AC | PRN

## 2018-02-16 MED ORDER — VALACYCLOVIR HCL 1 G PO TABS: 2000.0000 mg | ORAL_TABLET | Freq: Two times a day (BID) | ORAL | 1 refills | Status: DC

## 2018-02-16 MED ORDER — ESTRADIOL 0.025 MG/24HR TD PTTW: 1.0000 | MEDICATED_PATCH | TRANSDERMAL | 3 refills | Status: DC

## 2018-02-16 MED ORDER — PANTOPRAZOLE SODIUM 40 MG PO TBEC: 40.0000 mg | DELAYED_RELEASE_TABLET | Freq: Every day | ORAL | 3 refills | Status: AC

## 2018-02-16 NOTE — Progress Notes (Signed)
HPI: Tabitha Mcdonald is a 62 y.o. female who  has a past medical history of Arthritis, Asthma, Back pain, Cervical disc disorder with radiculopathy (01/21/2015), Complication of anesthesia, GERD (gastroesophageal reflux disease), History of hiatal hernia, Insomnia, and Pneumonia.  she presents to [facility] today, 02/16/18,  for chief complaint of: Annual Physical     Patient here for annual physical / wellness exam.  See preventive care reviewed as below.  Recent labs reviewed in detail with the patient.   Additional concerns today include:   Has some questions about hormone replacement therapy, how long she will have to be on this. Anytime she comes off of the estrogen she experiences hot flashes.  Noticing some sharp pain with urination past few days, baseline stress incontinence.     Past medical, surgical, social and family history reviewed:  Patient Active Problem List   Diagnosis Date Noted  . 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 Chronic insomnia 05/18/2016  . Cervical disc disorder with radiculopathy 01/21/2015    Past Surgical History:  Procedure Laterality Date  . ABDOMINAL HYSTERECTOMY    . ANTERIOR CERVICAL DECOMP/DISCECTOMY FUSION N/A 11/11/2017   Procedure: ANTERIOR CERVICAL DECOMPRESSION/DISCECTOMY FUSION CERVICAL SEVEN - THORACIC ONE;  Surgeon: Lisbeth RenshawNundkumar, Neelesh, MD;  Location: [facility] OR;  Service: Neurosurgery;  Laterality: N/A;  . APPENDECTOMY    . BACK SURGERY    . CERVICAL SPINE SURGERY     C5-C6  . CESAREAN SECTION    . CHOLECYSTECTOMY N/A 10/18/2017   Procedure: LAPAROSCOPIC CHOLECYSTECTOMY;  Surgeon: Berna Bueonnor, Chelsea A, MD;  Location: [facility] ORS;  Service: General;  Laterality: N/A;  . COLONOSCOPY    . DILATION AND CURETTAGE OF UTERUS    . TONSILLECTOMY      Social History   Tobacco Use  . Smoking status: Never Smoker  . Smokeless tobacco: Never Used  Substance Use Topics  . Alcohol use: No     Alcohol/week: 0.0 oz    Family History  Problem Relation Age of Onset  . Hypertension Mother   . Hypertension Father   . Heart disease Father   . Diabetes Son      Current medication list and allergy/intolerance information reviewed:    Current Outpatient Medications  Medication Sig Dispense Refill  . albuterol (PROVENTIL HFA;VENTOLIN HFA) 108 (90 Base) MCG/ACT inhaler Inhale 2 puffs into the lungs every 6 (six) hours as needed for wheezing. 1 Inhaler 0  . Cholecalciferol (VITAMIN D3 PO) Take 1 capsule by mouth daily.    Marland Kitchen. estradiol (VIVELLE-DOT) 0.025 MG/24HR Place 1 patch onto the skin 2 (two) times a week. 8 patch 12  . fexofenadine (ALLEGRA) 180 MG tablet Take 180 mg by mouth daily as needed.     Marland Kitchen. HYDROcodone-acetaminophen (NORCO) 10-325 MG tablet Take 1-2 tablets by mouth every 4 (four) hours as needed for severe pain ((score 7 to 10)). 50 tablet 0  . methocarbamol (ROBAXIN) 500 MG tablet Take 1 tablet (500 mg total) by mouth every 6 (six) hours as needed for muscle spasms. 50 tablet 0  . pantoprazole (PROTONIX) 40 MG tablet Take 40 mg by mouth daily.    . valACYclovir (VALTREX) 1000 MG tablet Take 1,000 mg by mouth 2 (two) times daily.    Marland Kitchen. zolpidem (AMBIEN) 10 MG tablet   4   No current facility-administered medications for this visit.     Allergies  Allergen Reactions  . Amoxicillin Nausea And Vomiting and Other (See Comments)  Causes upset stomach, worsening Acid reflux  . Zofran [Ondansetron Hcl] Other (See Comments)    Gives patient migraines  . Ibuprofen Nausea And Vomiting  . Oxycodone Nausea And Vomiting      Review of Systems:  Constitutional:  No  fever, no chills, No recent illness,  HEENT: No  headache, no vision change  Cardiac: No  chest pain, No  pressure, No palpitations  Respiratory:  No  shortness of breath. No  Cough  Gastrointestinal: No  abdominal pain, No  nausea, No  vomiting,  No  blood in stool, No  diarrhea, No  constipation    Musculoskeletal: No new myalgia/arthralgia  Skin: No  Rash, No other wounds/concerning lesions  Genitourinary: +incontinence, No  abnormal genital bleeding, No abnormal genital discharge  Hem/Onc: No  easy bruising/bleeding  Endocrine: No cold intolerance,  No heat intolerance. No polyuria/polydipsia/polyphagia   Neurologic: No  weakness, No  dizziness  Psychiatric: No  concerns with depression, No  concerns with anxiety  Exam:  BP (!) 155/92   Pulse (!) 108   Temp 98 F (36.7 C) (Oral)   Wt 188 lb 1.3 oz (85.3 kg)   BMI 32.28 kg/m    Constitutional: VS see above. General Appearance: alert, well-developed, well-nourished, NAD  Eyes: Normal lids and conjunctive, non-icteric sclera  Ears, Nose, Mouth, Throat: MMM, Normal external inspection ears/nares/mouth/lips/gums. TM normal bilaterally. Pharynx/tonsils no erythema, no exudate. Nasal mucosa normal.   Neck: No masses, trachea midline. No thyroid enlargement. No tenderness/mass appreciated. No lymphadenopathy  Respiratory: Normal respiratory effort. no wheeze, no rhonchi, no rales  Cardiovascular: S1/S2 normal, no murmur, no rub/gallop auscultated. RRR.   Gastrointestinal: Nontender, no masses. No hepatomegaly, no splenomegaly. No hernia appreciated. Bowel sounds normal. Rectal exam deferred.   Musculoskeletal: Gait normal. No clubbing/cyanosis of digits.   Neurological: Normal balance/coordination. No tremor. No cranial nerve deficit on limited exam. Motor and sensation intact and symmetric. Cerebellar reflexes intact.   Skin: warm, dry, intact. No rash/ulcer. No concerning nevi or subq nodules on limited exam.    Psychiatric: Normal judgment/insight. Normal mood and affect. Oriented x3.      ASSESSMENT/PLAN:   Annual physical exam - Plan: MM SCREENING BREAST TOMO BILATERAL, DG Bone Density, CBC, COMPLETE METABOLIC PANEL WITH GFR, Lipid panel, TSH, VITAMIN D 25 Hydroxy (Vit-D Deficiency, Fractures)  Routine  screening for STI (sexually transmitted infection) - Plan: HIV antibody, Hepatitis C antibody, C. trachomatis/N. gonorrhoeae RNA, RPR  Urination pain - Plan: Urinalysis, Routine w reflex microscopic  Elevated blood pressure reading   FEMALE PREVENTIVE CARE Updated 02/16/18   ANNUAL SCREENING/COUNSELING  Diet/Exercise - HEALTHY HABITS DISCUSSED TO DECREASE CV RISK Social History   Tobacco Use  Smoking Status Never Smoker  Smokeless Tobacco Never Used   Social History   Substance and Sexual Activity  Alcohol Use No  . Alcohol/week: 0.0 oz   Depression screen PHQ 2/9 02/16/2018  Decreased Interest 1  Down, Depressed, Hopeless 0  PHQ - 2 Score 1  Altered sleeping 0  Tired, decreased energy 1  Change in appetite 0  Feeling bad or failure about yourself  0  Trouble concentrating 0  Moving slowly or fidgety/restless 0  Suicidal thoughts 0  PHQ-9 Score 2  Difficult doing work/chores Very difficult    Domestic violence concerns - no  HTN SCREENING - SEE VITALS  SEXUAL HEALTH  Sexually active in the past year - Yes with female.  Need/want STI testing today? - no  Concerns about libido or  pain with sex? - no  Plans for pregnancy? - n/a  INFECTIOUS DISEASE SCREENING  HIV - does not need  GC/CT - does not need  HepC - DOB [birth year] - does not need  TB - does not need  DISEASE SCREENING  Lipid - needs  DM2 - needs  Osteoporosis - needs  CANCER SCREENING  Cervical - does not need  Breast - needs  Lung - does not need  Colon - does not need  ADULT VACCINATION  Influenza - annual vaccine recommended  Td - booster every 10 years   Zoster - Shingrix recommended 50+  PCV13 - was not indicated  PPSV23 - was not indicated Immunization History  Administered Date(s) Administered  . Influenza,inj,Quad PF,6+ Mos 10/16/2015, 09/15/2016    OTHER  Fall - exercise and Vit D age 7+ - does not need       Visit summary with medication list and  pertinent instructions was printed for patient to review. All questions at time of visit were answered - patient instructed to contact office with any additional concerns. ER/RTC precautions were reviewed with the patient.   Follow-up plan: Return in about 2 weeks (around 03/02/2018) for recheck BP w/ Dr Lyn Hollingshead.    Please note: voice recognition software was used to produce this document, and typos may escape review. Please contact Dr. Lyn Hollingshead for any needed clarifications.

## 2018-02-17 LAB — CBC
HEMATOCRIT: 40.1 % (ref 35.0–45.0)
HEMOGLOBIN: 13.5 g/dL (ref 11.7–15.5)
MCH: 28.9 pg (ref 27.0–33.0)
MCHC: 33.7 g/dL (ref 32.0–36.0)
MCV: 85.9 fL (ref 80.0–100.0)
MPV: 9.8 fL (ref 7.5–12.5)
Platelets: 229 10*3/uL (ref 140–400)
RBC: 4.67 10*6/uL (ref 3.80–5.10)
RDW: 13.3 % (ref 11.0–15.0)
WBC: 4.1 10*3/uL (ref 3.8–10.8)

## 2018-02-17 LAB — LIPID PANEL
CHOL/HDL RATIO: 2.7 (calc) (ref <5.0)
CHOLESTEROL: 167 mg/dL (ref <200)
HDL: 63 mg/dL (ref >50)
LDL CHOLESTEROL (CALC): 88 mg/dL
NON-HDL CHOLESTEROL (CALC): 104 mg/dL (ref <130)
Triglycerides: 69 mg/dL (ref <150)

## 2018-02-17 LAB — COMPLETE METABOLIC PANEL WITH GFR
AG Ratio: 1.6 (calc) (ref 1.0–2.5)
ALKALINE PHOSPHATASE (APISO): 38 U/L (ref 33–130)
ALT: 37 U/L — ABNORMAL HIGH (ref 6–29)
AST: 49 U/L — AB (ref 10–35)
Albumin: 4.5 g/dL (ref 3.6–5.1)
BUN: 9 mg/dL (ref 7–25)
CO2: 29 mmol/L (ref 20–32)
CREATININE: 0.82 mg/dL (ref 0.50–1.05)
Calcium: 9.4 mg/dL (ref 8.6–10.4)
Chloride: 105 mmol/L (ref 98–110)
GFR, EST NON AFRICAN AMERICAN: 78 mL/min/{1.73_m2} (ref >=60)
GFR, Est African American: 91 mL/min/{1.73_m2} (ref >=60)
GLOBULIN: 2.8 g/dL (ref 1.9–3.7)
GLUCOSE: 97 mg/dL (ref 65–99)
Potassium: 4.4 mmol/L (ref 3.5–5.3)
Sodium: 142 mmol/L (ref 135–146)
Total Bilirubin: 0.3 mg/dL (ref 0.2–1.2)
Total Protein: 7.3 g/dL (ref 6.1–8.1)

## 2018-02-17 LAB — URINALYSIS, ROUTINE W REFLEX MICROSCOPIC
Bilirubin Urine: NEGATIVE
Glucose, UA: NEGATIVE
HGB URINE DIPSTICK: NEGATIVE
KETONES UR: NEGATIVE
Leukocytes, UA: NEGATIVE
Nitrite: NEGATIVE
PROTEIN: NEGATIVE
Specific Gravity, Urine: 1.021 (ref 1.001–1.03)
pH: <=5 (ref 5.0–8.0)

## 2018-02-17 LAB — HIV ANTIBODY (ROUTINE TESTING W REFLEX): HIV 1&2 Ab, 4th Generation: NONREACTIVE

## 2018-02-17 LAB — HEPATITIS C ANTIBODY
HEP C AB: NONREACTIVE
SIGNAL TO CUT-OFF: 0.03 (ref <1.00)

## 2018-02-17 LAB — RPR: RPR: NONREACTIVE

## 2018-02-17 LAB — C. TRACHOMATIS/N. GONORRHOEAE RNA
C. trachomatis RNA, TMA: NOT DETECTED
N. gonorrhoeae RNA, TMA: NOT DETECTED

## 2018-02-17 LAB — VITAMIN D 25 HYDROXY (VIT D DEFICIENCY, FRACTURES): Vit D, 25-Hydroxy: 18 ng/mL — ABNORMAL LOW (ref 30–100)

## 2018-02-17 LAB — TSH: TSH: 2.13 mIU/L (ref 0.40–4.50)

## 2018-02-23 ENCOUNTER — Ambulatory Visit: Payer: BLUE CROSS/BLUE SHIELD | Attending: Physician Assistant

## 2018-02-23 ENCOUNTER — Other Ambulatory Visit: Payer: Self-pay

## 2018-02-23 DIAGNOSIS — M25612 Stiffness of left shoulder, not elsewhere classified: Secondary | ICD-10-CM | POA: Insufficient documentation

## 2018-02-23 DIAGNOSIS — M25611 Stiffness of right shoulder, not elsewhere classified: Secondary | ICD-10-CM | POA: Insufficient documentation

## 2018-02-23 DIAGNOSIS — M6281 Muscle weakness (generalized): Secondary | ICD-10-CM | POA: Diagnosis present

## 2018-02-23 DIAGNOSIS — M542 Cervicalgia: Secondary | ICD-10-CM | POA: Insufficient documentation

## 2018-02-23 DIAGNOSIS — R293 Abnormal posture: Secondary | ICD-10-CM

## 2018-02-23 DIAGNOSIS — R252 Cramp and spasm: Secondary | ICD-10-CM | POA: Diagnosis present

## 2018-02-23 NOTE — Therapy (Signed)
[facility] [address], [address] [place], [place], [address] Phone: [phone]   Fax:  [phone]  Physical Therapy Evaluation  Patient Details  Name: Tabitha Mcdonald MRN: [id number] Date of Birth: 05/19/1956 Referring Provider: Cindra Presumeostella, Vincent, [facility]   Encounter Date: 02/23/2018  PT End of Session - 02/23/18 1145    Visit Number  1    Date for PT Re-Evaluation  04/20/18    Authorization Type  BCBS    PT Start Time  1103    PT Stop Time  1147    PT Time Calculation (min)  44 min    Activity Tolerance  Patient tolerated treatment well    Behavior During Therapy  [facility] for tasks assessed/performed       Past Medical History:  Diagnosis Date  . Arthritis   . Asthma    seasonal  . Back pain   . Cervical disc disorder with radiculopathy 01/21/2015  . Complication of anesthesia    needs Glidescope for intubation as had cervical neck surgery  . GERD (gastroesophageal reflux disease)   . History of hiatal hernia    small hernia  . Insomnia   . Pneumonia     Past Surgical History:  Procedure Laterality Date  . ABDOMINAL HYSTERECTOMY    . ANTERIOR CERVICAL DECOMP/DISCECTOMY FUSION N/A 11/11/2017   Procedure: ANTERIOR CERVICAL DECOMPRESSION/DISCECTOMY FUSION CERVICAL SEVEN - THORACIC ONE;  Surgeon: Lisbeth RenshawNundkumar, Neelesh, MD;  Location: [facility] OR;  Service: Neurosurgery;  Laterality: N/A;  . APPENDECTOMY    . BACK SURGERY    . CERVICAL SPINE SURGERY     C5-C6  . CESAREAN SECTION    . CHOLECYSTECTOMY N/A 10/18/2017   Procedure: LAPAROSCOPIC CHOLECYSTECTOMY;  Surgeon: Berna Bueonnor, Chelsea A, MD;  Location: [facility];  Service: General;  Laterality: N/A;  . COLONOSCOPY    . DILATION AND CURETTAGE OF UTERUS    . TONSILLECTOMY      There were no vitals filed for this visit.   Subjective Assessment - 02/23/18 1116    Subjective  Pt presents to PT with central cervical pain and Rt UE radiculopathy s/p cervical fusion (C7-T1) performed  11/11/17.  Pt reports that she continues to have Rt UE radiculopathy after this surgery.      Pertinent History  2002: cervical fusion C5-6, 10/2017 C7-T1    Limitations  Sitting;Standing;Walking    How long can you sit comfortably?  10-15 minutes max    How long can you walk comfortably?  20 minutes- pain reported    Patient Stated Goals  reduce pain, sit longer, walking longer for exercise and in the community    Currently in Pain?  Yes    Pain Score  7     Pain Location  Neck    Pain Orientation  Left;Right;Posterior    Pain Type  Chronic pain;Surgical pain    Pain Onset  More than a month ago    Pain Frequency  Constant    Aggravating Factors   sitting, standing, turning head    Pain Relieving Factors  pain medication, rest         [facility] PT [facility] - 02/23/18 0001      Assessment   Medical Diagnosis  radiculopathy, cervical region    Referring Provider  Cindra Presumeostella, Vincent, [facility]    Onset Date/Surgical Date  11/11/17 ACDF 10/2017    Hand Dominance  Right    Next MD Visit  03/06/18    Prior Therapy  none  Precautions   Precautions  None;Cervical      Restrictions   Weight Bearing Restrictions  No      Balance Screen   Has the patient fallen in the past 6 months  No    Has the patient had a decrease in activity level because of a fear of falling?   No    Is the patient reluctant to leave their home because of a fear of falling?   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    Leisure  bowling -not able to since surgery, walking      Cognition   Overall Cognitive Status  Within Functional Limits for tasks assessed      Observation/Other Assessments   Focus on Therapeutic Outcomes (FOTO)   61% limitation      Posture/Postural Control   Posture/Postural Control  Postural  limitations    Postural Limitations  Rounded Shoulders;Forward head      ROM / Strength   AROM / PROM / Strength  AROM;PROM;Strength      AROM   Overall AROM   Deficits    Overall AROM Comments  Bil shoulder A/ROM limited to 90 degrees    AROM Assessment Site  Cervical    Cervical Flexion  15    Cervical Extension  15    Cervical - Right Side Bend  20    Cervical - Left Side Bend  15    Cervical - Right Rotation  35    Cervical - Left Rotation  30      Strength   Overall Strength  Deficits    Overall Strength Comments  Bil UE strength 3+/5 with pain and limited ability to resist due to report of pain      Palpation   Palpation comment  hypersensitivity to palpation over bil upper traps, cervical paraspinals and suboccipitals      Ambulation/Gait   Gait Pattern  Within Functional Limits             Objective measurements completed on examination: See above findings.              PT Education - 02/23/18 1143    Education provided  Yes    Education Details  posture, shoulder shrugs,gentle cervical A/ROM    Person(s) Educated  Patient    Methods  Explanation;Demonstration    Comprehension  Verbalized understanding;Returned demonstration       PT Short Term Goals - 02/23/18 1222      PT SHORT TERM GOAL #1   Title  be independent in initial HEP    Time  4    Status  New    Target Date  03/23/18      PT SHORT TERM GOAL #2   Title  tolerate sitting for 20 minutes without limitation due to pain    Time  4    Period  Weeks    Status  New    Target Date  03/23/18      PT SHORT TERM GOAL #3   Title  demonstrate bil cervical A/ROM rotation to > or = to 45 degrees to improve safety with driving    Time  4  Period  Weeks    Status  New    Target Date  03/23/18      PT SHORT TERM GOAL #4   Title  report a 25% reduction in cervical and Rt UE pain with ADLs and self-care    Time  4    Period  Weeks    Status  New    Target Date  03/23/18      PT  SHORT TERM GOAL #5   Title  walk for 20-30 minutes for exercise without limitation due to pain    Time  4    Period  Weeks    Status  New    Target Date  04/20/18        PT Long Term Goals - 02/23/18 1223      PT LONG TERM GOAL #1   Title  be independent in advanced HEP    Time  8    Period  Weeks    Status  New    Target Date  04/20/18      PT LONG TERM GOAL #2   Title  demonstrate cervical A/ROM rotation to > or = to 55 degrees to improve safety with driving    Time  8    Period  Weeks    Status  New    Target Date  04/20/18      PT LONG TERM GOAL #3   Title  demonstrate full bil UE A/ROM into flexion to allow for reaching overhead    Time  8    Period  Weeks    Status  New    Target Date  04/20/18      PT LONG TERM GOAL #4   Title  demonstrate 4+/5 bil shoulder strength to improve endurance with ADLs and self-care    Time  8    Period  Weeks    Status  New    Target Date  04/20/18      PT LONG TERM GOAL #5   Title  sit for > or = to 30 minutes without limitation due to neck pain    Time  8    Period  Weeks    Status  New    Target Date  04/20/18      Additional Long Term Goals   Additional Long Term Goals  Yes      PT LONG TERM GOAL #6   Title  demonstrate cervical A/ROM flexion to > or = to 35 degrees to improve function    Time  8    Period  Weeks    Status  New    Target Date  04/20/18      PT LONG TERM GOAL #7   Title  reduce FOTO to < or = to 48% limitation    Time  8    Status  New    Target Date  04/20/18             Plan - 02/23/18 1203    Clinical Impression Statement  Pt presents to PT with complaints of bil neck pain and Rt UE radiculopathy s/p cervcial fusion at C7-T1 performed 11/11/17.  Pt demonstrates guarding, significant limitation in bil UE A/ROM and cervical A/ROM.  Pt reports signficant pain with all UE and cervical A/ROM.  PT demonstrates guarded seated posture.  A significant portion of session was spent discussing  physical activity and need for return to regular movement.  Pt will benefit from skilled PT  to improve cervical A/ROM to improve function and safety with driving, reduce pain and postural strength t improve endurance.      History and Personal Factors relevant to plan of care:  2 cervical fusion, out of work,     Clinical Presentation  Evolving    Clinical Presentation due to:  pain persisting and significant A/ROM and strength s/p surgery 4 months ago    Clinical Decision Making  Moderate    Rehab Potential  Good    PT Frequency  2x / week    PT Duration  8 weeks    PT Treatment/Interventions  Cryotherapy;Electrical Stimulation;Ultrasound;Moist Heat;Gait training;Stair training;Functional mobility training;Therapeutic activities;Therapeutic exercise;Patient/family education;Neuromuscular re-education;Passive range of motion;Taping;Dry needling    PT Next Visit Plan  Gentle progression of UE flexibility and cervical flexibility, try pulleys, supine cervical A/ROM, postural strength    Consulted and Agree with Plan of Care  Patient       Patient will benefit from skilled therapeutic intervention in order to improve the following deficits and impairments:  Pain, Improper body mechanics, Postural dysfunction, Decreased mobility, Increased muscle spasms, Decreased activity tolerance, Decreased range of motion, Decreased strength, Impaired UE functional use, Difficulty walking, Impaired flexibility  Visit Diagnosis: Cervicalgia - Plan: PT plan of care cert/re-cert  Abnormal posture - Plan: PT plan of care cert/re-cert  Muscle weakness (generalized) - Plan: PT plan of care cert/re-cert  Stiffness of left shoulder, not elsewhere classified - Plan: PT plan of care cert/re-cert  Stiffness of right shoulder, not elsewhere classified - Plan: PT plan of care cert/re-cert  Cramp and spasm - Plan: PT plan of care cert/re-cert     Problem List Patient Active Problem List   Diagnosis Date Noted  .  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 Chronic insomnia 05/18/2016  . Cervical disc disorder with radiculopathy 01/21/2015     Tabitha Mcdonald, PT 02/23/18 12:33 PM  [facility] [address], [address] [place], [place], [address] Phone: [phone]   Fax:  [phone]  Name: Tabitha Mcdonald MRN: [id number] Date of Birth: [birth year]

## 2018-02-23 NOTE — Patient Instructions (Signed)
PERFORM ALL EXERCISES GENTLY AND WITH GOOD POSTURE.    5 SECOND HOLD, 3 REPS TO EACH SIDE. 4-5 TIMES EACH DAY.   AROM: Neck Rotation   Turn head slowly to look over one shoulder, then the other.   AROM: Neck Flexion   Bend head forward.   AROM: Lateral Neck Flexion   Slowly tilt head toward one shoulder, then the other.    Shoulder shrugs x10   Posture - Standing   Good posture is important. Avoid slouching and forward head thrust. Maintain curve in low back and align ears over shoulders, hips over ankles.  Pull your belly button in toward your back bone. Posture Tips DO: - stand tall and erect - keep chin tucked in - keep head and shoulders in alignment - check posture regularly in mirror or large window - pull head back against headrest in car seat;  Change your position often.  Sit with lumbar support. DON'T: - slouch or slump while watching TV or reading - sit, stand or lie in one position  for too long;  Sitting is especially hard on the spine so if you sit at a desk/use the computer, then stand up often! Copyright  VHI. All rights reserved.  Posture - Sitting  Sit upright, head facing forward. Try using a roll to support lower back. Keep shoulders relaxed, and avoid rounded back. Keep hips level with knees. Avoid crossing legs for long periods. Copyright  [employer]HI. All rights reserved.  Chronic neck strain can develop because of poor posture and faulty work habits  Postural strain related to slumped sitting and forward head posture is a leading cause of headaches, neck and upper back pain  General strengthening and flexibility exercises are helpful in the treatment of neck pain.  Most importantly, you should learn to correct the posture that may be contributing to chronic pain.   Change positions frequently  Change your work or home environment to improve posture and mechanics.   [facility] [address], [address] [place], [place] [address] Phone #  [phone] Fax [phone]

## 2018-03-01 ENCOUNTER — Ambulatory Visit: Payer: BLUE CROSS/BLUE SHIELD

## 2018-03-01 DIAGNOSIS — R252 Cramp and spasm: Secondary | ICD-10-CM

## 2018-03-01 DIAGNOSIS — M542 Cervicalgia: Secondary | ICD-10-CM | POA: Diagnosis not present

## 2018-03-01 DIAGNOSIS — M25611 Stiffness of right shoulder, not elsewhere classified: Secondary | ICD-10-CM

## 2018-03-01 DIAGNOSIS — R293 Abnormal posture: Secondary | ICD-10-CM

## 2018-03-01 DIAGNOSIS — M6281 Muscle weakness (generalized): Secondary | ICD-10-CM

## 2018-03-01 DIAGNOSIS — M25612 Stiffness of left shoulder, not elsewhere classified: Secondary | ICD-10-CM

## 2018-03-01 NOTE — Therapy (Addendum)
[facility] [address], [place], [place], [address] Phone: [phone]   Fax:  [phone]  Physical Therapy Treatment  Patient Details  Name: Tabitha Mcdonald MRN: [id number] Date of Birth: 05-06-1956 Referring Provider: Ferne Reus, [facility]   Encounter Date: 03/01/2018  PT End of Session - 03/01/18 0928    Visit Number  2    Date for PT Re-Evaluation  04/20/18    Authorization Type  [employer]    Authorization - Visit Number  2    Authorization - Number of Visits  30    PT Start Time  6712    PT Stop Time  0935    PT Time Calculation (min)  48 min    Activity Tolerance  Patient limited by pain    Behavior During Therapy  [facility] for tasks assessed/performed       Past Medical History:  Diagnosis Date  . Arthritis   . Asthma    seasonal  . Back pain   . Cervical disc disorder with radiculopathy 01/21/2015  . Complication of anesthesia    needs Glidescope for intubation as had cervical neck surgery  . GERD (gastroesophageal reflux disease)   . History of hiatal hernia    small hernia  . Insomnia   . Pneumonia     Past Surgical History:  Procedure Laterality Date  . ABDOMINAL HYSTERECTOMY    . ANTERIOR CERVICAL DECOMP/DISCECTOMY FUSION N/A 11/11/2017   Procedure: ANTERIOR CERVICAL DECOMPRESSION/DISCECTOMY FUSION CERVICAL SEVEN - THORACIC ONE;  Surgeon: Consuella Lose, MD;  Location: [place];  Service: Neurosurgery;  Laterality: N/A;  . APPENDECTOMY    . BACK SURGERY    . CERVICAL SPINE SURGERY     C5-C6  . CESAREAN SECTION    . CHOLECYSTECTOMY N/A 10/18/2017   Procedure: LAPAROSCOPIC CHOLECYSTECTOMY;  Surgeon: Clovis Riley, MD;  Location: [facility];  Service: General;  Laterality: N/A;  . COLONOSCOPY    . DILATION AND CURETTAGE OF UTERUS    . TONSILLECTOMY      There were no vitals filed for this visit.  Subjective Assessment - 03/01/18 0849    Subjective  I am hurting.      Pertinent History   2002: cervical fusion C5-6, 10/2017 C7-T1    Currently in Pain?  Yes    Pain Score  6     Pain Location  Neck    Pain Orientation  Right;Left;Posterior    Pain Descriptors / Indicators  Aching;Tightness    Pain Type  Chronic pain;Surgical pain    Pain Onset  More than a month ago    Pain Frequency  Constant    Aggravating Factors   sitting, standing, turning head    Pain Relieving Factors  pain, medication, rest                      [facility] Adult PT Treatment/Exercise - 03/01/18 0001      Exercises   Exercises  Shoulder;Neck      Neck Exercises: Seated   Shoulder Shrugs  10 reps    Shoulder Rolls  10 reps      Neck Exercises: Supine   Cervical Rotation  10 reps      Shoulder Exercises: Supine   Other Supine Exercises  scapular depression 2x10      Shoulder Exercises: Seated   Other Seated Exercises  chest press with cane: partial A/ROM only 2x10      Shoulder Exercises: Pulleys  Flexion  1 minute pt asked to stop "that is all I can do"      Modalities   Modalities  Electrical Stimulation;Moist Heat      Moist Heat Therapy   Number Minutes Moist Heat  15 Minutes    Moist Heat Location  Cervical      Electrical Stimulation   Electrical Stimulation Location  neck    Electrical Stimulation Action  IFC    Electrical Stimulation Parameters  15 minutes    Electrical Stimulation Goals  Pain      Neck Exercises: Stretches   Other Neck Stretches  cervical A/ROM 3 ways: within pt tolerance (guarded) 3x5 seconds each.               PT Short Term Goals - 02/23/18 1222      PT SHORT TERM GOAL #1   Title  be independent in initial HEP    Time  4    Status  New    Target Date  03/23/18      PT SHORT TERM GOAL #2   Title  tolerate sitting for 20 minutes without limitation due to pain    Time  4    Period  Weeks    Status  New    Target Date  03/23/18      PT SHORT TERM GOAL #3   Title  demonstrate bil cervical A/ROM rotation to > or = to 45  degrees to improve safety with driving    Time  4    Period  Weeks    Status  New    Target Date  03/23/18      PT SHORT TERM GOAL #4   Title  report a 25% reduction in cervical and Rt UE pain with ADLs and self-care    Time  4    Period  Weeks    Status  New    Target Date  03/23/18      PT SHORT TERM GOAL #5   Title  walk for 20-30 minutes for exercise without limitation due to pain    Time  4    Period  Weeks    Status  New    Target Date  04/20/18        PT Long Term Goals - 02/23/18 1223      PT LONG TERM GOAL #1   Title  be independent in advanced HEP    Time  8    Period  Weeks    Status  New    Target Date  04/20/18      PT LONG TERM GOAL #2   Title  demonstrate cervical A/ROM rotation to > or = to 55 degrees to improve safety with driving    Time  8    Period  Weeks    Status  New    Target Date  04/20/18      PT LONG TERM GOAL #3   Title  demonstrate full bil UE A/ROM into flexion to allow for reaching overhead    Time  8    Period  Weeks    Status  New    Target Date  04/20/18      PT LONG TERM GOAL #4   Title  demonstrate 4+/5 bil shoulder strength to improve endurance with ADLs and self-care    Time  8    Period  Weeks    Status  New    Target Date  04/20/18  PT LONG TERM GOAL #5   Title  sit for > or = to 30 minutes without limitation due to neck pain    Time  8    Period  Weeks    Status  New    Target Date  04/20/18      Additional Long Term Goals   Additional Long Term Goals  Yes      PT LONG TERM GOAL #6   Title  demonstrate cervical A/ROM flexion to > or = to 35 degrees to improve function    Time  8    Period  Weeks    Status  New    Target Date  04/20/18      PT LONG TERM GOAL #7   Title  reduce FOTO to < or = to 48% limitation    Time  8    Status  New    Target Date  04/20/18            Plan - 03/01/18 0856    Clinical Impression Statement  Pt is guarded with all movement and does not toerate much activity.   PT educated pt throughout the session regarding importance of movement and why muscle spasms can occur with new movement. Pt not able to perform exercise with full UE A/ROM and not able to tolerate supine >5 minutes due to increased pain in the thoracic and low back. Pt is guarded with all movement.  Pt reports 6/10 neck and UE pain bilaterally.  Pt will benefit from skilled PT to improve return to regular movement, pain management and strength progression.      PT Frequency  2x / week    PT Duration  8 weeks    PT Treatment/Interventions  Cryotherapy;Electrical Stimulation;Ultrasound;Moist Heat;Gait training;Stair training;Functional mobility training;Therapeutic activities;Therapeutic exercise;Patient/family education;Neuromuscular re-education;Passive range of motion;Taping;Dry needling    PT Next Visit Plan  Gentle progression of UE flexibility and cervical flexibility, try pulleys, supine cervical A/ROM, postural strength    Recommended Other Services  initial certification is signed    Consulted and Agree with Plan of Care  Patient       Patient will benefit from skilled therapeutic intervention in order to improve the following deficits and impairments:  Pain, Improper body mechanics, Postural dysfunction, Decreased mobility, Increased muscle spasms, Decreased activity tolerance, Decreased range of motion, Decreased strength, Impaired UE functional use, Difficulty walking, Impaired flexibility  Visit Diagnosis: Cervicalgia  Muscle weakness (generalized)  Abnormal posture  Stiffness of left shoulder, not elsewhere classified  Stiffness of right shoulder, not elsewhere classified  Cramp and spasm     Problem List Patient Active Problem List   Diagnosis Date Noted  . 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 Chronic insomnia 05/18/2016  . Cervical disc disorder with radiculopathy 01/21/2015     Sigurd Sos, PT 03/01/18 9:30 AM PHYSICAL THERAPY  DISCHARGE SUMMARY  Visits from Start of Care: 2  Current functional level related to goals / functional outcomes: Pt didn't return to PT after visit on 03/01/18.  See above for most current status.     Remaining deficits: See above.   Education / Equipment: Importance of movement, HEP, posture Plan: Patient agrees to discharge.  Patient goals were not met. Patient is being discharged due to not returning since the last visit.  ?????        Sigurd Sos, PT 04/10/18 10:36 AM   [facility] [address] [place],  [place], [address] Phone: [phone]   Fax:  [phone]  Name: JENNY LAI MRN: [id number] Date of Birth: Jan 20, 1956

## 2018-04-13 ENCOUNTER — Ambulatory Visit (INDEPENDENT_AMBULATORY_CARE_PROVIDER_SITE_OTHER): Payer: BLUE CROSS/BLUE SHIELD

## 2018-04-13 DIAGNOSIS — Z1231 Encounter for screening mammogram for malignant neoplasm of breast: Secondary | ICD-10-CM | POA: Diagnosis not present

## 2018-04-20 ENCOUNTER — Ambulatory Visit: Payer: BLUE CROSS/BLUE SHIELD | Admitting: Rehabilitative and Restorative Service Providers"

## 2018-05-12 ENCOUNTER — Other Ambulatory Visit: Payer: Self-pay | Admitting: Osteopathic Medicine

## 2018-05-19 ENCOUNTER — Other Ambulatory Visit: Payer: Self-pay | Admitting: Osteopathic Medicine

## 2018-05-22 ENCOUNTER — Ambulatory Visit: Payer: BLUE CROSS/BLUE SHIELD | Admitting: Osteopathic Medicine

## 2018-05-26 ENCOUNTER — Ambulatory Visit: Payer: BLUE CROSS/BLUE SHIELD | Admitting: Osteopathic Medicine

## 2018-06-02 ENCOUNTER — Ambulatory Visit: Payer: BLUE CROSS/BLUE SHIELD | Admitting: Osteopathic Medicine

## 2018-06-02 ENCOUNTER — Encounter: Payer: Self-pay | Admitting: Osteopathic Medicine

## 2018-06-02 VITALS — BP 149/95 | HR 68 | Temp 98.3°F | Wt 196.2 lb

## 2018-06-02 DIAGNOSIS — I10 Essential (primary) hypertension: Secondary | ICD-10-CM

## 2018-06-02 DIAGNOSIS — A6000 Herpesviral infection of urogenital system, unspecified: Secondary | ICD-10-CM | POA: Insufficient documentation

## 2018-06-02 MED ORDER — HYDROCHLOROTHIAZIDE 12.5 MG PO TABS: 12.5000 mg | ORAL_TABLET | Freq: Every day | ORAL | 1 refills | Status: DC

## 2018-06-02 MED ORDER — VALACYCLOVIR HCL 1 G PO TABS: 1000.0000 mg | ORAL_TABLET | Freq: Two times a day (BID) | ORAL | 3 refills | Status: DC

## 2018-06-02 MED ORDER — VALACYCLOVIR HCL 1 G PO TABS: 1000.0000 mg | ORAL_TABLET | Freq: Every day | ORAL | 3 refills | Status: AC

## 2018-06-02 NOTE — Patient Instructions (Signed)
DASH Eating Plan DASH stands for "Dietary Approaches to Stop Hypertension." The DASH eating plan is a healthy eating plan that has been shown to reduce high blood pressure (hypertension). It may also reduce your risk for type 2 diabetes, heart disease, and stroke. The DASH eating plan may also help with weight loss. What are tips for following this plan? General guidelines  Avoid eating more than 2,300 mg (milligrams) of salt (sodium) a day. If you have hypertension, you may need to reduce your sodium intake to 1,500 mg a day.  Limit alcohol intake to no more than 1 drink a day for nonpregnant women and 2 drinks a day for men. One drink equals 12 oz of beer, 5 oz of wine, or 1 oz of hard liquor.  Work with your health care provider to maintain a healthy body weight or to lose weight. Ask what an ideal weight is for you.  Get at least 30 minutes of exercise that causes your heart to beat faster (aerobic exercise) most days of the week. Activities may include walking, swimming, or biking.  Work with your health care provider or diet and nutrition specialist (dietitian) to adjust your eating plan to your individual calorie needs. Reading food labels  Check food labels for the amount of sodium per serving. Choose foods with less than 5 percent of the Daily Value of sodium. Generally, foods with less than 300 mg of sodium per serving fit into this eating plan.  To find whole grains, look for the word "whole" as the first word in the ingredient list. Shopping  Buy products labeled as "low-sodium" or "no salt added."  Buy fresh foods. Avoid canned foods and premade or frozen meals. Cooking  Avoid adding salt when cooking. Use salt-free seasonings or herbs instead of table salt or sea salt. Check with your health care provider or pharmacist before using salt substitutes.  Do not fry foods. Cook foods using healthy methods such as baking, boiling, grilling, and broiling instead.  Cook with  heart-healthy oils, such as olive, canola, soybean, or sunflower oil. Meal planning   Eat a balanced diet that includes: ? 5 or more servings of fruits and vegetables each day. At each meal, try to fill half of your plate with fruits and vegetables. ? Up to 6-8 servings of whole grains each day. ? Less than 6 oz of lean meat, poultry, or fish each day. A 3-oz serving of meat is about the same size as a deck of cards. One egg equals 1 oz. ? 2 servings of low-fat dairy each day. ? A serving of nuts, seeds, or beans 5 times each week. ? Heart-healthy fats. Healthy fats called Omega-3 fatty acids are found in foods such as flaxseeds and coldwater fish, like sardines, salmon, and mackerel.  Limit how much you eat of the following: ? Canned or prepackaged foods. ? Food that is high in trans fat, such as fried foods. ? Food that is high in saturated fat, such as fatty meat. ? Sweets, desserts, sugary drinks, and other foods with added sugar. ? Full-fat dairy products.  Do not salt foods before eating.  Try to eat at least 2 vegetarian meals each week.  Eat more home-cooked food and less restaurant, buffet, and fast food.  When eating at a restaurant, ask that your food be prepared with less salt or no salt, if possible. What foods are recommended? The items listed may not be a complete list. Talk with your dietitian about what   dietary choices are best for you. Grains Whole-grain or whole-wheat bread. Whole-grain or whole-wheat pasta. Brown rice. Oatmeal. Quinoa. Bulgur. Whole-grain and low-sodium cereals. Pita bread. Low-fat, low-sodium crackers. Whole-wheat flour tortillas. Vegetables Fresh or frozen vegetables (raw, steamed, roasted, or grilled). Low-sodium or reduced-sodium tomato and vegetable juice. Low-sodium or reduced-sodium tomato sauce and tomato paste. Low-sodium or reduced-sodium canned vegetables. Fruits All fresh, dried, or frozen fruit. Canned fruit in natural juice (without  added sugar). Meat and other protein foods Skinless chicken or turkey. Ground chicken or turkey. Pork with fat trimmed off. Fish and seafood. Egg whites. Dried beans, peas, or lentils. Unsalted nuts, nut butters, and seeds. Unsalted canned beans. Lean cuts of beef with fat trimmed off. Low-sodium, lean deli meat. Dairy Low-fat (1%) or fat-free (skim) milk. Fat-free, low-fat, or reduced-fat cheeses. Nonfat, low-sodium ricotta or cottage cheese. Low-fat or nonfat yogurt. Low-fat, low-sodium cheese. Fats and oils Soft margarine without trans fats. Vegetable oil. Low-fat, reduced-fat, or light mayonnaise and salad dressings (reduced-sodium). Canola, safflower, olive, soybean, and sunflower oils. Avocado. Seasoning and other foods Herbs. Spices. Seasoning mixes without salt. Unsalted popcorn and pretzels. Fat-free sweets. What foods are not recommended? The items listed may not be a complete list. Talk with your dietitian about what dietary choices are best for you. Grains Baked goods made with fat, such as croissants, muffins, or some breads. Dry pasta or rice meal packs. Vegetables Creamed or fried vegetables. Vegetables in a cheese sauce. Regular canned vegetables (not low-sodium or reduced-sodium). Regular canned tomato sauce and paste (not low-sodium or reduced-sodium). Regular tomato and vegetable juice (not low-sodium or reduced-sodium). Pickles. Olives. Fruits Canned fruit in a light or heavy syrup. Fried fruit. Fruit in cream or butter sauce. Meat and other protein foods Fatty cuts of meat. Ribs. Fried meat. Bacon. Sausage. Bologna and other processed lunch meats. Salami. Fatback. Hotdogs. Bratwurst. Salted nuts and seeds. Canned beans with added salt. Canned or smoked fish. Whole eggs or egg yolks. Chicken or turkey with skin. Dairy Whole or 2% milk, cream, and half-and-half. Whole or full-fat cream cheese. Whole-fat or sweetened yogurt. Full-fat cheese. Nondairy creamers. Whipped toppings.  Processed cheese and cheese spreads. Fats and oils Butter. Stick margarine. Lard. Shortening. Ghee. Bacon fat. Tropical oils, such as coconut, palm kernel, or palm oil. Seasoning and other foods Salted popcorn and pretzels. Onion salt, garlic salt, seasoned salt, table salt, and sea salt. Worcestershire sauce. Tartar sauce. Barbecue sauce. Teriyaki sauce. Soy sauce, including reduced-sodium. Steak sauce. Canned and packaged gravies. Fish sauce. Oyster sauce. Cocktail sauce. Horseradish that you find on the shelf. Ketchup. Mustard. Meat flavorings and tenderizers. Bouillon cubes. Hot sauce and Tabasco sauce. Premade or packaged marinades. Premade or packaged taco seasonings. Relishes. Regular salad dressings. Where to find more information:  National Heart, Lung, and Blood Institute: www.nhlbi.nih.gov  American Heart Association: www.heart.org Summary  The DASH eating plan is a healthy eating plan that has been shown to reduce high blood pressure (hypertension). It may also reduce your risk for type 2 diabetes, heart disease, and stroke.  With the DASH eating plan, you should limit salt (sodium) intake to 2,300 mg a day. If you have hypertension, you may need to reduce your sodium intake to 1,500 mg a day.  When on the DASH eating plan, aim to eat more fresh fruits and vegetables, whole grains, lean proteins, low-fat dairy, and heart-healthy fats.  Work with your health care provider or diet and nutrition specialist (dietitian) to adjust your eating plan to your individual   calorie needs. This information is not intended to replace advice given to you by your health care provider. Make sure you discuss any questions you have with your health care provider. Document Released: 11/18/2011 Document Revised: 11/22/2016 Document Reviewed: 11/22/2016 Elsevier Interactive Patient Education  2018 Elsevier Inc.  

## 2018-06-02 NOTE — Progress Notes (Signed)
HPI: Tabitha Mcdonald is a 62 y.o. female who  has a past medical history of Arthritis, Asthma, Back pain, Cervical disc disorder with radiculopathy (01/21/2015), Complication of anesthesia, GERD (gastroesophageal reflux disease), History of hiatal hernia, Insomnia, and Pneumonia.  she presents to [facility] today, 06/02/18,  for chief complaint of:  BP follow-up Discuss Valtrex   BP: elevated on intake 146/87. Recheck 149/95. Reports pain from recent surgery, the surgery was back in November.  Reports positive family history of hypertension, she has never had to be on medications in the past.  Valtrex: Miscommunication regarding previous prescription, she was previously on this daily for prevention of general outbreaks, previously prescribed by OB/GYN.     Past medical history, surgical history, and family history reviewed.  Current medication list and allergy/intolerance information reviewed.   (See remainder of HPI, ROS, Phys Exam below)  BP (!) 146/87 (BP Location: Left Arm, Patient Position: Sitting, Cuff Size: Normal)   Pulse 69   Temp 98.3 F (36.8 C) (Oral)   Wt 196 lb 3.2 oz (89 kg)   BMI 33.68 kg/m    ASSESSMENT/PLAN:   Essential hypertension - Will start low-dose hydrochlorothiazide, recheck BMP in about 6 weeks.  Discussed risks versus benefits, possible side effects of medicines - Plan: Basic metabolic panel  Recurrent genital HSV (herpes simplex virus) infection - Valtrex medication adjusted, will update problem list/medical history   Meds ordered this encounter  Medications  . hydrochlorothiazide (HYDRODIURIL) 12.5 MG tablet    Sig: Take 1 tablet (12.5 mg total) by mouth daily.    Dispense:  90 tablet    Refill:  1  . DISCONTD: valACYclovir (VALTREX) 1000 MG tablet    Sig: Take 1 tablet (1,000 mg total) by mouth 2 (two) times daily.    Dispense:  90 tablet    Refill:  3  . valACYclovir (VALTREX) 1000 MG tablet    Sig: Take  1 tablet (1,000 mg total) by mouth daily.    Dispense:  90 tablet    Refill:  3    Cancel order for BID thanks      Follow-up plan: Return in about 6 weeks (around 07/14/2018) for 2 weeks nurse visit BP check on new meds, 6-8 weeks LAB VISIT ONLY .     ############################################ ############################################ ############################################ ############################################    Outpatient Encounter Medications as of 06/02/2018  Medication Sig Note  . albuterol (PROVENTIL HFA;VENTOLIN HFA) 108 (90 Base) MCG/ACT inhaler Inhale 2 puffs into the lungs every 6 (six) hours as needed for wheezing.   . Cholecalciferol (VITAMIN D3 PO) Take 1 capsule by mouth daily. 11/09/2017: Out of this medication at the present time  . estradiol (VIVELLE-DOT) 0.025 MG/24HR PLACE 1 PATCH ONTO THE SKIN 2 (TWO) TIMES A WEEK.   . fexofenadine (ALLEGRA) 180 MG tablet Take 1 tablet (180 mg total) by mouth daily as needed.   Marland Kitchen HYDROcodone-acetaminophen (NORCO) 10-325 MG tablet Take 1-2 tablets by mouth every 4 (four) hours as needed for severe pain ((score 7 to 10)).   Marland Kitchen methocarbamol (ROBAXIN) 500 MG tablet Take 1 tablet (500 mg total) by mouth every 6 (six) hours as needed for muscle spasms.   . pantoprazole (PROTONIX) 40 MG tablet Take 1 tablet (40 mg total) by mouth daily.   . valACYclovir (VALTREX) 1000 MG tablet TAKE 2 TABLETS (2,000 MG TOTAL) BY MOUTH 2 (TWO) TIMES DAILY. FOR ONE DAY, AS NEEDED FOR OUTBREAK   . zolpidem (AMBIEN) 10 MG  tablet     No facility-administered encounter medications on file as of 06/02/2018.    Allergies  Allergen Reactions  . Amoxicillin Nausea And Vomiting and Other (See Comments)    Causes upset stomach, worsening Acid reflux  . Zofran [Ondansetron Hcl] Other (See Comments)    Gives patient migraines  . Ibuprofen Nausea And Vomiting  . Oxycodone Nausea And Vomiting      Review of Systems:  Constitutional: No  recent illness  HEENT: +headache, no vision change  Cardiac: No  chest pain, No  pressure, No palpitations  Respiratory:  No  shortness of breath. No  Cough  Gastrointestinal: No  abdominal pain, no change on bowel habits  Musculoskeletal: No new myalgia/arthralgia  Neurologic: No  weakness, No  Dizziness   Exam:  BP (!) 149/95 (BP Location: Left Arm, Patient Position: Sitting, Cuff Size: Normal)   Pulse 68   Temp 98.3 F (36.8 C) (Oral)   Wt 196 lb 3.2 oz (89 kg)   BMI 33.68 kg/m   Constitutional: VS see above. General Appearance: alert, well-developed, well-nourished, NAD  Eyes: Normal lids and conjunctive, non-icteric sclera  Ears, Nose, Mouth, Throat: MMM, Normal external inspection ears/nares/mouth/lips/gums.  Neck: No masses, trachea midline.   Respiratory: Normal respiratory effort. no wheeze, no rhonchi, no rales  Cardiovascular: S1/S2 normal, no murmur, no rub/gallop auscultated. RRR.   Musculoskeletal: Gait normal. Symmetric and independent movement of all extremities  Neurological: Normal balance/coordination. No tremor.  Skin: warm, dry, intact.   Psychiatric: Normal judgment/insight. Normal mood and affect. Oriented x3.   Visit summary with medication list and pertinent instructions was printed for patient to review, advised to alert us if any changes needed. All questions at time of visit were answered - patient instructed to contact office with any additional concerns. ER/RTC precautions were reviewed with the patient and understanding verbalized.   Follow-up plan: Return in about 6 weeks (around 07/14/2018) for 2 weeks nurse visit BP check on new meds, 6-8 weeks LAB VISIT ONLY .    Please note: voice recognition software was used to produce this document, and typos may escape review. Please contact Dr. Lyn HollingsheadAlexander for any needed clarifications.

## 2018-07-15 ENCOUNTER — Other Ambulatory Visit: Payer: Self-pay | Admitting: Physician Assistant

## 2018-07-15 DIAGNOSIS — M5412 Radiculopathy, cervical region: Secondary | ICD-10-CM

## 2018-07-17 ENCOUNTER — Other Ambulatory Visit: Payer: Self-pay | Admitting: Physician Assistant

## 2018-07-17 ENCOUNTER — Ambulatory Visit: Payer: Self-pay

## 2018-07-17 DIAGNOSIS — M5412 Radiculopathy, cervical region: Secondary | ICD-10-CM

## 2018-08-07 ENCOUNTER — Other Ambulatory Visit: Payer: Self-pay

## 2018-08-09 ENCOUNTER — Other Ambulatory Visit: Payer: Self-pay | Admitting: Physician Assistant

## 2018-08-09 DIAGNOSIS — M5412 Radiculopathy, cervical region: Secondary | ICD-10-CM

## 2018-08-17 ENCOUNTER — Other Ambulatory Visit: Payer: Self-pay | Admitting: Physician Assistant

## 2018-08-17 DIAGNOSIS — M5412 Radiculopathy, cervical region: Secondary | ICD-10-CM

## 2018-08-28 ENCOUNTER — Ambulatory Visit
Admission: RE | Admit: 2018-08-28 | Discharge: 2018-08-28 | Disposition: A | Discharge status: Home / Self Care | Payer: BLUE CROSS/BLUE SHIELD | Source: Ambulatory Visit | Attending: Physician Assistant | Admitting: Physician Assistant

## 2018-08-28 DIAGNOSIS — M5412 Radiculopathy, cervical region: Secondary | ICD-10-CM

## 2018-08-28 MED ORDER — GADOBENATE DIMEGLUMINE 529 MG/ML IV SOLN
18.0000 mL | Freq: Once | INTRAVENOUS | Status: AC | PRN
  Administered 2018-08-28: 18 mL via INTRAVENOUS

## 2018-09-11 ENCOUNTER — Ambulatory Visit: Payer: Self-pay | Admitting: Physician Assistant

## 2018-09-19 ENCOUNTER — Ambulatory Visit (INDEPENDENT_AMBULATORY_CARE_PROVIDER_SITE_OTHER): Payer: BLUE CROSS/BLUE SHIELD | Admitting: Osteopathic Medicine

## 2018-09-19 ENCOUNTER — Encounter: Payer: Self-pay | Admitting: Osteopathic Medicine

## 2018-09-19 ENCOUNTER — Ambulatory Visit (INDEPENDENT_AMBULATORY_CARE_PROVIDER_SITE_OTHER): Payer: BLUE CROSS/BLUE SHIELD

## 2018-09-19 VITALS — BP 133/66 | HR 81 | Temp 98.0°F | Wt 198.7 lb

## 2018-09-19 DIAGNOSIS — R079 Chest pain, unspecified: Secondary | ICD-10-CM

## 2018-09-19 DIAGNOSIS — R002 Palpitations: Secondary | ICD-10-CM

## 2018-09-19 DIAGNOSIS — R0789 Other chest pain: Secondary | ICD-10-CM

## 2018-09-19 NOTE — Patient Instructions (Signed)
For chest pain without a clear cause, ruling out heart attack is of utmost importance.  The best place to do this is an emergency room where appropriate testing and follow-up can be done, and appropriate staff and resources are in place should you become worse.

## 2018-09-19 NOTE — Progress Notes (Signed)
HPI: Tabitha Mcdonald is a 62 y.o. female who  has a past medical history of Arthritis, Asthma, Back pain, Cervical disc disorder with radiculopathy (01/21/2015), Complication of anesthesia, GERD (gastroesophageal reflux disease), History of hiatal hernia, Insomnia, and Pneumonia.  she presents to [facility] today, 09/19/18,  for chief complaint of:  Chest pain  "Wheezing" and chest pain for about a week. Headache for a few days. Wheezing described as tightness in the chest, whistling in the lungs, not really bothering her today. She describes central chest pin/tightness worse with exertion, no pain at rest. No Hx MI/CAD, no cardiac cath or stress test past. Reports heart racing, randomly. No cough or SOB. (+)Fatigue.     Past medical history, surgical history, and family history reviewed.  Current medication list and allergy/intolerance information reviewed.   (See remainder of HPI, ROS, Phys Exam below)  EKG interpretation: Rate: 77 Rhythm: sinus No ST/T changes concerning for acute ischemia/infarct  Previous EKG 10/11/17 was normal   BP 133/66 (BP Location: Left Arm, Patient Position: Sitting, Cuff Size: Normal)   Pulse 81   Temp 98 F (36.7 C) (Oral)   Wt 198 lb 11.2 oz (90.1 kg)   SpO2 100%   BMI 34.11 kg/m      ASSESSMENT/PLAN: The primary encounter diagnosis was Palpitations. Diagnoses of Chest tightness and Exertional chest pain were also pertinent to this visit.   Best case scenario, viral respiratory infection and/or costochondritis, though patient really has not had any upper respiratory symptoms consistent with cold/rhinovirus, no fever, no coughing, no shortness of breath.  Chest discomfort on exertion is certainly concerning.    Patient was advised that ER evaluation is the appropriate and safest course of action for chest pain symptoms, to rule out acute coronary syndrome/MI, despite reassuring EKG here in the office.    She was  a bit reluctant to go to the ER. We went ahead and got a chest x-ray, with the understanding that if this showed something like an obvious pneumonia or musculoskeletal abnormality we could treat that and defer further cardiac work-up, though I would strongly recommend follow-up with cardiology for consideration of stress testing. On personal review of CXR no obvious respiratory or msk pathology   Patient states she will go to [facility] when she feels she needs to - risks were reiterated with the patient including risk of death.    Patient Instructions  For chest pain without a clear cause, ruling out heart attack is of utmost importance.  The best place to do this is an emergency room where appropriate testing and follow-up can be done, and appropriate staff and resources are in place should you become worse.    Follow-up plan: Return for routine care / as needed but ER transfer is recommended today to r/o ACS .                    ############################################ ############################################ ############################################ ############################################    Outpatient Encounter Medications as of 09/19/2018  Medication Sig Note  . albuterol (PROVENTIL HFA;VENTOLIN HFA) 108 (90 Base) MCG/ACT inhaler Inhale 2 puffs into the lungs every 6 (six) hours as needed for wheezing.   . Cholecalciferol (VITAMIN D3 PO) Take 1 capsule by mouth daily. 11/09/2017: Out of this medication at the present time  . estradiol (VIVELLE-DOT) 0.025 MG/24HR PLACE 1 PATCH ONTO THE SKIN 2 (TWO) TIMES A WEEK.   . fexofenadine (ALLEGRA) 180 MG tablet Take 1 tablet (180 mg  total) by mouth daily as needed.   . hydrochlorothiazide (HYDRODIURIL) 12.5 MG tablet Take 1 tablet (12.5 mg total) by mouth daily.   Marland Kitchen HYDROcodone-acetaminophen (NORCO) 10-325 MG tablet Take 1-2 tablets by mouth every 4 (four) hours as needed for severe pain ((score 7 to 10)).   Marland Kitchen  methocarbamol (ROBAXIN) 500 MG tablet Take 1 tablet (500 mg total) by mouth every 6 (six) hours as needed for muscle spasms.   . pantoprazole (PROTONIX) 40 MG tablet Take 1 tablet (40 mg total) by mouth daily.   . valACYclovir (VALTREX) 1000 MG tablet Take 1 tablet (1,000 mg total) by mouth daily.   Marland Kitchen zolpidem (AMBIEN) 10 MG tablet     No facility-administered encounter medications on file as of 09/19/2018.    Allergies  Allergen Reactions  . Amoxicillin Nausea And Vomiting and Other (See Comments)    Causes upset stomach, worsening Acid reflux  . Zofran [Ondansetron Hcl] Other (See Comments)    Gives patient migraines  . Ibuprofen Nausea And Vomiting  . Oxycodone Nausea And Vomiting      Review of Systems:  Constitutional: No recent illness  HEENT: +headache, no vision change  Cardiac: +chest pain, +pressure, +palpitations  Respiratory:  No  shortness of breath. No  Cough  Gastrointestinal: No  abdominal pain, no change on bowel habits  Musculoskeletal: No new myalgia/arthralgia  Skin: No  Rash  Hem/Onc: No  easy bruising/bleeding, No  abnormal lumps/bumps  Neurologic: +generalized weakness, No  Dizziness  Psychiatric: No  concerns with depression, No  concerns with anxiety  Exam:  BP 133/66 (BP Location: Left Arm, Patient Position: Sitting, Cuff Size: Normal)   Pulse 81   Temp 98 F (36.7 C) (Oral)   Wt 198 lb 11.2 oz (90.1 kg)   SpO2 100%   BMI 34.11 kg/m   Constitutional: VS see above. General Appearance: alert, well-developed, well-nourished, NAD  Eyes: Normal lids and conjunctive, non-icteric sclera  Ears, Nose, Mouth, Throat: MMM, Normal external inspection ears/nares/mouth/lips/gums.  Neck: No masses, trachea midline.   Respiratory: Normal respiratory effort. no wheeze, no rhonchi, no rales, questionable coarse breath sounds more notable on RLL but otherwise normal.   Cardiovascular: S1/S2 normal, no murmur, no rub/gallop auscultated. RRR.    Musculoskeletal: Gait normal. Symmetric and independent movement of all extremities. (+)tenderness L sternal border  Neurological: Normal balance/coordination. No tremor.  Skin: warm, dry, intact.   Psychiatric: Normal judgment/insight. Normal mood and affect. Oriented x3.   Visit summary with medication list and pertinent instructions was printed for patient to review, advised to alert [place] if any changes needed. All questions at time of visit were answered - patient instructed to contact office with any additional concerns. ER/RTC precautions were reviewed with the patient and understanding verbalized.   Follow-up plan: Return for routine care / as needed but ER transfer is recommended today to r/o ACS .   Please note: voice recognition software was used to produce this document, and typos may escape review. Please contact Dr. Lyn Hollingshead for any needed clarifications.

## 2018-09-20 ENCOUNTER — Emergency Department (HOSPITAL_COMMUNITY): Payer: BLUE CROSS/BLUE SHIELD

## 2018-09-20 ENCOUNTER — Encounter (HOSPITAL_COMMUNITY): Payer: Self-pay | Admitting: Emergency Medicine

## 2018-09-20 ENCOUNTER — Emergency Department (HOSPITAL_COMMUNITY)
Admission: EM | Admit: 2018-09-20 | Discharge: 2018-09-20 | Disposition: A | Discharge status: Home / Self Care | Payer: BLUE CROSS/BLUE SHIELD | Attending: Emergency Medicine | Admitting: Emergency Medicine

## 2018-09-20 DIAGNOSIS — R0602 Shortness of breath: Secondary | ICD-10-CM | POA: Diagnosis not present

## 2018-09-20 DIAGNOSIS — R071 Chest pain on breathing: Secondary | ICD-10-CM | POA: Diagnosis not present

## 2018-09-20 DIAGNOSIS — Z79899 Other long term (current) drug therapy: Secondary | ICD-10-CM | POA: Insufficient documentation

## 2018-09-20 DIAGNOSIS — J45909 Unspecified asthma, uncomplicated: Secondary | ICD-10-CM | POA: Diagnosis not present

## 2018-09-20 DIAGNOSIS — R079 Chest pain, unspecified: Secondary | ICD-10-CM | POA: Insufficient documentation

## 2018-09-20 LAB — I-STAT TROPONIN, ED: Troponin i, poc: 0 ng/mL (ref 0.00–0.08)

## 2018-09-20 LAB — BASIC METABOLIC PANEL
Anion gap: 6 (ref 5–15)
BUN: 10 mg/dL (ref 6–20)
CALCIUM: 9.4 mg/dL (ref 8.9–10.3)
CO2: 29 mmol/L (ref 22–32)
CREATININE: 0.81 mg/dL (ref 0.44–1.00)
Chloride: 108 mmol/L (ref 98–111)
GFR calc non Af Amer: >60 mL/min (ref >60)
Glucose, Bld: 102 mg/dL — ABNORMAL HIGH (ref 70–99)
Potassium: 3.9 mmol/L (ref 3.5–5.1)
SODIUM: 143 mmol/L (ref 135–145)

## 2018-09-20 LAB — CBC
HCT: 40.8 % (ref 36.0–46.0)
Hemoglobin: 13.2 g/dL (ref 12.0–15.0)
MCH: 29.7 pg (ref 26.0–34.0)
MCHC: 32.4 g/dL (ref 30.0–36.0)
MCV: 91.9 fL (ref 80.0–100.0)
NRBC: 0 % (ref 0.0–0.2)
PLATELETS: 212 10*3/uL (ref 150–400)
RBC: 4.44 MIL/uL (ref 3.87–5.11)
RDW: 13.6 % (ref 11.5–15.5)
WBC: 4.2 10*3/uL (ref 4.0–10.5)

## 2018-09-20 LAB — D-DIMER, QUANTITATIVE: D-Dimer, Quant: 0.52 ug/mL-FEU — ABNORMAL HIGH (ref 0.00–0.50)

## 2018-09-20 LAB — TROPONIN I

## 2018-09-20 MED ORDER — SODIUM CHLORIDE 0.9 % IJ SOLN
INTRAMUSCULAR | Status: AC
  Filled 2018-09-20: qty 50

## 2018-09-20 MED ORDER — GI COCKTAIL ~~LOC~~
30.0000 mL | Freq: Once | ORAL | Status: AC
  Administered 2018-09-20: 30 mL via ORAL
  Filled 2018-09-20: qty 30

## 2018-09-20 MED ORDER — IOPAMIDOL (ISOVUE-370) INJECTION 76%
100.0000 mL | Freq: Once | INTRAVENOUS | Status: AC | PRN
  Administered 2018-09-20: 80 mL via INTRAVENOUS

## 2018-09-20 MED ORDER — IOPAMIDOL (ISOVUE-370) INJECTION 76%
INTRAVENOUS | Status: AC
  Filled 2018-09-20: qty 100

## 2018-09-20 MED ORDER — OMEPRAZOLE 20 MG PO CPDR: 20.0000 mg | DELAYED_RELEASE_CAPSULE | Freq: Every day | ORAL | 0 refills | Status: AC

## 2018-09-20 NOTE — ED Triage Notes (Signed)
Patient here from home with complaints of central chest pain/pressure. Reports that feels like pressure in chest. Denies n/v.

## 2018-09-20 NOTE — ED Provider Notes (Addendum)
[facility]-EMERGENCY DEPT Provider Note   CSN: [id number] Arrival date & time: 09/20/18  1016     History   Chief Complaint Chief Complaint  Patient presents with  . Chest Pain    HPI Tabitha Mcdonald is a 62 y.o. female with a history of GERD who presents emergency department today for chest pain.  Patient reports that 2 weeks ago she was at her desk when she started developing chest heaviness that is in the center of her chest and does not radiate to her neck, jaw, back, abdomen or either shoulder.  She notes it has been constant since onset.  She notes it is worsened with deep breathing.  She notes a mild shortness of breath at rest and also with exertion.  The pain is not exertional or positional in nature.  No associated nausea, vomiting, diaphoresis.  Denies any fever, chills, cough, or lower extremity swelling.  No recent surgery, travel, immobilization.  Patient is on estrogen replacement therapy (patch).  Denies any history of MI, CVA or TIA.  No history of hypertension, hyperlipidemia, diabetes.  No family history of CAD.  She reports a stress test approximately 20 years ago that was normal.  She does not follow with cardiology.  She saw her PCP yesterday I told her if her symptoms continue to present to the emergency department.  HPI  Past Medical History:  Diagnosis Date  . Arthritis   . Asthma    seasonal  . Back pain   . Cervical disc disorder with radiculopathy 01/21/2015  . Complication of anesthesia    needs Glidescope for intubation as had cervical neck surgery  . GERD (gastroesophageal reflux disease)   . History of hiatal hernia    small hernia  . Insomnia   . Pneumonia     Patient Active Problem List   Diagnosis Date Noted  . Recurrent genital HSV (herpes simplex virus) infection 06/02/2018  . Essential hypertension 06/02/2018  . 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 Chronic insomnia 05/18/2016  . Cervical disc  disorder with radiculopathy 01/21/2015    Past Surgical History:  Procedure Laterality Date  . ABDOMINAL HYSTERECTOMY    . ANTERIOR CERVICAL DECOMP/DISCECTOMY FUSION N/A 11/11/2017   Procedure: ANTERIOR CERVICAL DECOMPRESSION/DISCECTOMY FUSION CERVICAL SEVEN - THORACIC ONE;  Surgeon: Lisbeth Renshaw, MD;  Location: [facility] OR;  Service: Neurosurgery;  Laterality: N/A;  . APPENDECTOMY    . BACK SURGERY    . CERVICAL SPINE SURGERY     C5-C6  . CESAREAN SECTION    . CHOLECYSTECTOMY N/A 10/18/2017   Procedure: LAPAROSCOPIC CHOLECYSTECTOMY;  Surgeon: Berna Bue, MD;  Location: [facility] ORS;  Service: General;  Laterality: N/A;  . COLONOSCOPY    . DILATION AND CURETTAGE OF UTERUS    . TONSILLECTOMY       OB History    Gravida  2   Para  2   Term      Preterm      AB      Living  1     SAB      TAB      Ectopic      Multiple      Live Births               Home Medications    Prior to Admission medications   Medication Sig Start Date End Date Taking? Authorizing Provider  albuterol (PROVENTIL HFA;VENTOLIN HFA) 108 (90 Base) MCG/ACT inhaler Inhale 2 puffs  into the lungs every 6 (six) hours as needed for wheezing. 01/27/17   Sunnie Nielsen, DO  Cholecalciferol (VITAMIN D3 PO) Take 1 capsule by mouth daily.    [provider]  estradiol (VIVELLE-DOT) 0.025 MG/24HR PLACE 1 PATCH ONTO THE SKIN 2 (TWO) TIMES A WEEK. 05/15/18   Sunnie Nielsen, DO  fexofenadine (ALLEGRA) 180 MG tablet Take 1 tablet (180 mg total) by mouth daily as needed. 02/16/18   Sunnie Nielsen, DO  hydrochlorothiazide (HYDRODIURIL) 12.5 MG tablet Take 1 tablet (12.5 mg total) by mouth daily. 06/02/18   Sunnie Nielsen, DO  HYDROcodone-acetaminophen (NORCO) 10-325 MG tablet Take 1-2 tablets by mouth every 4 (four) hours as needed for severe pain ((score 7 to 10)). 11/12/17   Tressie Stalker, MD  methocarbamol (ROBAXIN) 500 MG tablet Take 1 tablet (500 mg total) by mouth every 6 (six)  hours as needed for muscle spasms. 11/12/17   Tressie Stalker, MD  pantoprazole (PROTONIX) 40 MG tablet Take 1 tablet (40 mg total) by mouth daily. 02/16/18   Sunnie Nielsen, DO  valACYclovir (VALTREX) 1000 MG tablet Take 1 tablet (1,000 mg total) by mouth daily. 06/02/18   Sunnie Nielsen, DO  zolpidem Remus Loffler) 10 MG tablet  02/12/18   [provider]    Family History Family History  Problem Relation Age of Onset  . Hypertension Mother   . Hypertension Father   . Heart disease Father   . Diabetes Son     Social History Social History   Tobacco Use  . Smoking status: Never Smoker  . Smokeless tobacco: Never Used  Substance Use Topics  . Alcohol use: No    Alcohol/week: 0.0 standard drinks  . Drug use: No     Allergies   Amoxicillin; Ondansetron hcl; Ibuprofen; and Oxycodone   Review of Systems Review of Systems  All other systems reviewed and are negative.    Physical Exam Updated Vital Signs BP (!) 140/94 (BP Location: Right Arm)   Pulse 73   Temp 98.2 F (36.8 C) (Oral)   Resp 18   Ht 5\' 4"  (1.626 m)   Wt 87.1 kg   SpO2 99%   BMI 32.96 kg/m   Physical Exam  Constitutional: She appears well-developed and well-nourished.  HENT:  Head: Normocephalic and atraumatic.  Right Ear: External ear normal.  Left Ear: External ear normal.  Nose: Nose normal.  Mouth/Throat: Uvula is midline, oropharynx is clear and moist and mucous membranes are normal. No tonsillar exudate.  Eyes: Pupils are equal, round, and reactive to light. Right eye exhibits no discharge. Left eye exhibits no discharge. No scleral icterus.  Neck: Trachea normal. Neck supple. No spinous process tenderness present. No neck rigidity. Normal range of motion present.  Cardiovascular: Normal rate, regular rhythm and intact distal pulses.  No murmur heard. Pulses:      Radial pulses are 2+ on the right side, and 2+ on the left side.       Dorsalis pedis pulses are 2+ on the right side,  and 2+ on the left side.       Posterior tibial pulses are 2+ on the right side, and 2+ on the left side.  No lower extremity swelling or edema. Calves symmetric in size bilaterally.  Pulmonary/Chest: Effort normal and breath sounds normal. She exhibits no tenderness.  No increased work of breathing. No accessory muscle use. Patient is sitting upright, speaking in full sentences without difficulty   Abdominal: Soft. Bowel sounds are normal. There is no  tenderness. There is no rebound and no guarding.  Musculoskeletal: She exhibits no edema.  Lymphadenopathy:    She has no cervical adenopathy.  Neurological: She is alert.  Skin: Skin is warm and dry. No rash noted. She is not diaphoretic.  Psychiatric: She has a normal mood and affect.  Nursing note and vitals reviewed.    ED Treatments / Results  Labs (all labs ordered are listed, but only abnormal results are displayed) Labs Reviewed  BASIC METABOLIC PANEL - Abnormal; Notable for the following components:      Result Value   Glucose, Bld 102 (*)    All other components within normal limits  D-DIMER, QUANTITATIVE (NOT AT [facility]) - Abnormal; Notable for the following components:   D-Dimer, Quant 0.52 (*)    All other components within normal limits  CBC  TROPONIN I  I-STAT TROPONIN, ED  I-STAT TROPONIN, ED    EKG EKG Interpretation  Date/Time:  Wednesday September 20 2018 10:22:21 EDT Ventricular Rate:  74 PR Interval:    QRS Duration: 91 QT Interval:  405 QTC Calculation: 450 R Axis:   45 Text Interpretation:  Sinus rhythm since last tracing no significant change Confirmed by Mancel Bale [phone]) on 09/20/2018 10:41:24 AM   Radiology Dg Chest 2 View  Result Date: 09/20/2018 CLINICAL DATA:  Central chest pain and pressure. EXAM: CHEST - 2 VIEW COMPARISON:  09/19/2018 FINDINGS: Cardiac silhouette is top-normal in size. No mediastinal hilar masses. No evidence of adenopathy. Clear lungs.  No pleural effusion or  pneumothorax. Stable changes from a prior anterior cervical spine fusion. Skeletal structures are intact. IMPRESSION: No active cardiopulmonary disease. Electronically Signed   By: Amie Portland M.D.   On: 09/20/2018 11:40   Dg Chest 2 View  Result Date: 09/19/2018 CLINICAL DATA:  Acute chest pain for 2 weeks. EXAM: CHEST - 2 VIEW COMPARISON:  10/11/2017 and prior radiographs FINDINGS: The cardiomediastinal silhouette is unremarkable. There is no evidence of focal airspace disease, pulmonary edema, suspicious pulmonary nodule/mass, pleural effusion, or pneumothorax. No acute bony abnormalities are identified. Cervical fusion hardware again noted. IMPRESSION: No active cardiopulmonary disease. Electronically Signed   By: Harmon Pier M.D.   On: 09/19/2018 17:32    Procedures Procedures (including critical care time)  Medications Ordered in ED Medications  sodium chloride 0.9 % injection (has no administration in time range)  iopamidol (ISOVUE-370) 76 % injection (has no administration in time range)  gi cocktail (Maalox,Lidocaine,Donnatal) (30 mLs Oral Given 09/20/18 1234)  iopamidol (ISOVUE-370) 76 % injection 100 mL (80 mLs Intravenous Contrast Given 09/20/18 1425)     Initial Impression / Assessment and Plan / ED Course  I have reviewed the triage vital signs and the nursing notes.  Pertinent labs & imaging results that were available during my care of the patient were reviewed by me and considered in my medical decision making (see chart for details).     62 y.o. female with a history of GERD who presents emergency department today for chest pain.  Patient reports that 2 weeks ago she was at her desk when she started developing chest heaviness that is in the center of her chest and does not radiate to her neck, jaw, back, abdomen or either shoulder.  She notes it has been constant since onset.  She notes it is worsened with deep breathing.  She notes a mild shortness of breath at rest and  also with exertion.  The pain is not exertional or positional in nature.  No associated nausea, vomiting, diaphoresis.  Denies any fever, chills, cough, or lower extremity swelling.  No recent surgery, travel, immobilization.  Patient is on estrogen replacement therapy (patch).  Denies any history of MI, CVA or TIA.  No history of hypertension, hyperlipidemia, diabetes.  No family history of CAD.  She reports a stress test approximately 20 years ago that was normal.  She does not follow with cardiology.  She saw her PCP yesterday I told her if her symptoms continue to present to the emergency department.   Patient is to be discharged with recommendation to follow up with PCP in regards to today's hospital visit. Patients workup has been reassuring. Negative CTA (no evidence of PE), stable vital signs, no tracheal deviation, no JVD or new murmur, RRR, breath sounds equal bilaterally, EKG without acute abnormalities (no ST changes, no evidence of STEMI), negative troponin x 2, and negative CXR. HEART score is 2. Patient has been advised to return to the ED if chest pain becomes exertional, associated with diaphoresis or nausea, radiates to left jaw/arm, worsens or becomes concerning in any way. Patient appears reliable for follow up and is agreeable to discharge. I advised the patient to follow-up with their primary care provider this week. I advised the patient to return to the emergency department with new or worsening symptoms or new concerns. The patient verbalized understanding and agreement with plan. Patient stable for discharge.   Final Clinical Impressions(s) / ED Diagnoses   Final diagnoses:  Nonspecific chest pain  Shortness of breath    ED Discharge Orders         Ordered    omeprazole (PRILOSEC) 20 MG capsule  Daily     09/20/18 1552           Jacinto Halim, PA-C 09/20/18 1552    Jacinto Halim, PA-C 09/20/18 1553    Mancel Bale, MD 09/20/18 1626

## 2018-09-20 NOTE — Discharge Instructions (Signed)
Read instructions below for reasons to return to the Emergency Department. It is recommended that your follow up with your Primary Care Doctor in regards to today's visit. If you do not have a doctor, use the resource guide listed below to help you find one.  Begin taking over the counter Prilosec or Zegrid (omeprazole) as directed.   Tests performed today include: An EKG of your heart A chest x-ray. D-Dimer CTA of your chest  Cardiac enzymes - a blood test for heart muscle damage x 2 Blood counts and electrolytes Vital signs. See below for your results today.   Chest Pain (Nonspecific)  HOME CARE INSTRUCTIONS  For the next few days, avoid physical activities that bring on chest pain. Continue physical activities as directed.  Do not smoke cigarettes or drink alcohol until your symptoms are gone. If you do smoke, it is time to quit. You may receive instructions and counseling on how to stop smoking. Only take over-the-counter or prescription medicine for pain, discomfort, or fever as directed by your caregiver.  Follow your caregiver's suggestions for further testing if your chest pain does not go away.  Keep any follow-up appointments you made. If you do not go to an appointment, you could develop lasting (chronic) problems with pain. If there is any problem keeping an appointment, you must call to reschedule.  SEEK MEDICAL CARE IF:  You think you are having problems from the medicine you are taking. Read your medicine instructions carefully.  Your chest pain does not go away, even after treatment.  You develop a rash with blisters on your chest.  SEEK IMMEDIATE MEDICAL CARE IF:  You have increased chest pain or pain that spreads to your arm, neck, jaw, back, or belly (abdomen).  You develop shortness of breath, an increasing cough, or you are coughing up blood.  You have severe back or abdominal pain, feel sick to your stomach (nauseous) or throw up (vomit).  You develop severe weakness,  fainting, or chills.  You have an oral temperature above 102 F (38.9 C), not controlled by medicine.  THIS IS AN EMERGENCY. Do not wait to see if the pain will go away. Get medical help at once. Call your local emergency services (911 in [place] Do not drive yourself to the hospital. Additional Information:  Your vital signs today were: BP (!) 163/89    Pulse 68    Temp 98.2 F (36.8 C) (Oral)    Resp 18    Ht 5\' 4"  (1.626 m)    Wt 87.1 kg    SpO2 97%    BMI 32.96 kg/m  If your blood pressure (BP) was elevated above 135/85 this visit, please have this repeated by your doctor within one month. ---------------

## 2018-10-13 ENCOUNTER — Other Ambulatory Visit: Payer: Self-pay

## 2018-10-13 ENCOUNTER — Emergency Department (INDEPENDENT_AMBULATORY_CARE_PROVIDER_SITE_OTHER): Payer: BLUE CROSS/BLUE SHIELD

## 2018-10-13 ENCOUNTER — Emergency Department
Admission: EM | Admit: 2018-10-13 | Discharge: 2018-10-13 | Disposition: A | Discharge status: Home / Self Care | Payer: BLUE CROSS/BLUE SHIELD | Source: Home / Self Care | Attending: Family Medicine | Admitting: Family Medicine

## 2018-10-13 ENCOUNTER — Encounter: Payer: Self-pay | Admitting: *Deleted

## 2018-10-13 DIAGNOSIS — R05 Cough: Secondary | ICD-10-CM | POA: Diagnosis not present

## 2018-10-13 DIAGNOSIS — R0602 Shortness of breath: Secondary | ICD-10-CM

## 2018-10-13 DIAGNOSIS — J4521 Mild intermittent asthma with (acute) exacerbation: Secondary | ICD-10-CM | POA: Diagnosis not present

## 2018-10-13 DIAGNOSIS — R079 Chest pain, unspecified: Secondary | ICD-10-CM | POA: Diagnosis not present

## 2018-10-13 MED ORDER — ALBUTEROL SULFATE HFA 108 (90 BASE) MCG/ACT IN AERS: 1.0000 | INHALATION_SPRAY | Freq: Four times a day (QID) | RESPIRATORY_TRACT | 0 refills | Status: AC | PRN

## 2018-10-13 MED ORDER — IPRATROPIUM-ALBUTEROL 0.5-2.5 (3) MG/3ML IN SOLN
3.0000 mL | Freq: Once | RESPIRATORY_TRACT | Status: AC
  Administered 2018-10-13: 3 mL via RESPIRATORY_TRACT

## 2018-10-13 MED ORDER — DEXAMETHASONE SODIUM PHOSPHATE 10 MG/ML IJ SOLN
10.0000 mg | Freq: Once | INTRAMUSCULAR | Status: AC
  Administered 2018-10-13: 10 mg via INTRAMUSCULAR

## 2018-10-13 NOTE — ED Provider Notes (Signed)
[facility]    CSN: [id number] Arrival date & time: 10/13/18  1046     History   Chief Complaint Chief Complaint  Patient presents with  . Cough    HPI Tabitha Mcdonald is a 62 y.o. female.   HPI  Tabitha Mcdonald is a 62 y.o. female presenting to UC with c/o sudden onset cough, chest congestion, and chest tightness since yesterday. Cough is non-productive. Associated post-tussive vomiting this morning.  Pt states her "breathing feels off."  She has taken Mucinex and cough syrup yesterday w/o relief. Hx of asthma. She has used an inhaler and prednisone in the past but does not currently have an inhaler at home. No fever, chills, nausea or diarrhea. Pt believes symptoms are due to sudden weather change yesterday with temperature dropping from 80*F to 30s*F.    Past Medical History:  Diagnosis Date  . Arthritis   . Asthma    seasonal  . Back pain   . Cervical disc disorder with radiculopathy 01/21/2015  . Complication of anesthesia    needs Glidescope for intubation as had cervical neck surgery  . GERD (gastroesophageal reflux disease)   . History of hiatal hernia    small hernia  . Insomnia   . Pneumonia     Patient Active Problem List   Diagnosis Date Noted  . Recurrent genital HSV (herpes simplex virus) infection 06/02/2018  . Essential hypertension 06/02/2018  . 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 Chronic insomnia 05/18/2016  . Cervical disc disorder with radiculopathy 01/21/2015    Past Surgical History:  Procedure Laterality Date  . ABDOMINAL HYSTERECTOMY    . ANTERIOR CERVICAL DECOMP/DISCECTOMY FUSION N/A 11/11/2017   Procedure: ANTERIOR CERVICAL DECOMPRESSION/DISCECTOMY FUSION CERVICAL SEVEN - THORACIC ONE;  Surgeon: Lisbeth Renshaw, MD;  Location: [facility];  Service: Neurosurgery;  Laterality: N/A;  . APPENDECTOMY    . BACK SURGERY    . CERVICAL SPINE SURGERY     C5-C6  . CESAREAN SECTION    . CHOLECYSTECTOMY N/A  10/18/2017   Procedure: LAPAROSCOPIC CHOLECYSTECTOMY;  Surgeon: Berna Bue, MD;  Location: [facility];  Service: General;  Laterality: N/A;  . COLONOSCOPY    . DILATION AND CURETTAGE OF UTERUS    . TONSILLECTOMY      OB History    Gravida  2   Para  2   Term      Preterm      AB      Living  1     SAB      TAB      Ectopic      Multiple      Live Births               Home Medications    Prior to Admission medications   Medication Sig Start Date End Date Taking? Authorizing Provider  albuterol (PROVENTIL HFA;VENTOLIN HFA) 108 (90 Base) MCG/ACT inhaler Inhale 1-2 puffs into the lungs every 6 (six) hours as needed for wheezing or shortness of breath. 10/13/18   Lurene Shadow, PA-C  Cholecalciferol (VITAMIN D3 PO) Take 1 capsule by mouth daily.    [provider]  estradiol (VIVELLE-DOT) 0.025 MG/24HR PLACE 1 PATCH ONTO THE SKIN 2 (TWO) TIMES A WEEK. 05/15/18   Sunnie Nielsen, DO  fexofenadine (ALLEGRA) 180 MG tablet Take 1 tablet (180 mg total) by mouth daily as needed. 02/16/18   Sunnie Nielsen, DO  hydrochlorothiazide (HYDRODIURIL) 12.5 MG tablet Take 1  tablet (12.5 mg total) by mouth daily. Patient not taking: Reported on 09/20/2018 06/02/18   Sunnie Nielsen, DO  HYDROcodone-acetaminophen [facility]) 10-325 MG tablet Take 1-2 tablets by mouth every 4 (four) hours as needed for severe pain ((score 7 to 10)). 11/12/17   Tressie Stalker, MD  methocarbamol (ROBAXIN) 500 MG tablet Take 1 tablet (500 mg total) by mouth every 6 (six) hours as needed for muscle spasms. 11/12/17   Tressie Stalker, MD  omeprazole (PRILOSEC) 20 MG capsule Take 1 capsule (20 mg total) by mouth daily. 09/20/18   Maczis, Elmer Sow, PA-C  pantoprazole (PROTONIX) 40 MG tablet Take 1 tablet (40 mg total) by mouth daily. 02/16/18   Sunnie Nielsen, DO  valACYclovir (VALTREX) 1000 MG tablet Take 1 tablet (1,000 mg total) by mouth daily. 06/02/18   Sunnie Nielsen, DO  zolpidem Remus Loffler)  10 MG tablet  02/12/18   [provider]    Family History Family History  Problem Relation Age of Onset  . Hypertension Mother   . Hypertension Father   . Heart disease Father   . Diabetes Son     Social History Social History   Tobacco Use  . Smoking status: Never Smoker  . Smokeless tobacco: Never Used  Substance Use Topics  . Alcohol use: No    Alcohol/week: 0.0 standard drinks  . Drug use: No     Allergies   Amoxicillin; Ondansetron hcl; Ibuprofen; and Oxycodone   Review of Systems Review of Systems  Constitutional: Negative for chills and fever.  HENT: Positive for congestion. Negative for ear pain, sinus pressure, sinus pain and sore throat.   Respiratory: Positive for cough, chest tightness and shortness of breath. Negative for wheezing.   Cardiovascular: Negative for chest pain and palpitations.     Physical Exam Triage Vital Signs ED Triage Vitals [10/13/18 1102]  Enc Vitals Group     BP 132/83     Pulse Rate 78     Resp 18     Temp 98.1 F (36.7 C)     Temp Source Oral     SpO2 98 %     Weight 202 lb (91.6 kg)     Height 5\' 4"  (1.626 m)     Head Circumference      Peak Flow      Pain Score 0     Pain Loc      Pain Edu?      Excl. in GC?    No data found.  Updated Vital Signs BP 132/83 (BP Location: Right Arm)   Pulse 78   Temp 98.1 F (36.7 C) (Oral)   Resp 18   Ht 5\' 4"  (1.626 m)   Wt 202 lb (91.6 kg)   SpO2 98%   BMI 34.67 kg/m   Visual Acuity Right Eye Distance:   Left Eye Distance:   Bilateral Distance:    Right Eye Near:   Left Eye Near:    Bilateral Near:     Physical Exam  Constitutional: She is oriented to person, place, and time. She appears well-developed and well-nourished. No distress.  HENT:  Head: Normocephalic and atraumatic.  Right Ear: Tympanic membrane normal.  Left Ear: Tympanic membrane normal.  Nose: Nose normal. Right sinus exhibits no maxillary sinus tenderness and no frontal sinus  tenderness. Left sinus exhibits no maxillary sinus tenderness and no frontal sinus tenderness.  Mouth/Throat: Uvula is midline, oropharynx is clear and moist and mucous membranes are normal.  Eyes: EOM are normal.  Neck: Normal range of motion. Neck supple.  Cardiovascular: Normal rate and regular rhythm.  Pulmonary/Chest: Effort normal. No respiratory distress. She has decreased breath sounds in the right lower field and the left lower field. She has no wheezes. She has no rhonchi. She has no rales.  Musculoskeletal: Normal range of motion.  Neurological: She is alert and oriented to person, place, and time.  Skin: Skin is warm and dry. She is not diaphoretic.  Psychiatric: She has a normal mood and affect. Her behavior is normal.  Nursing note and vitals reviewed.    UC Treatments / Results  Labs (all labs ordered are listed, but only abnormal results are displayed) Labs Reviewed - No data to display  EKG None  Radiology Dg Chest 2 View  Result Date: 10/13/2018 CLINICAL DATA:  62 year old female with cough shortness of breath and chest pain for 2 days. EXAM: CHEST - 2 VIEW COMPARISON:  Chest CTA 09/20/2018 and earlier. FINDINGS: Stable to mildly lower lung volumes. Stable cardiac size at the upper limits of normal. Other mediastinal contours are within normal limits. Both lungs appear stable and clear. No pneumothorax or pleural effusion. Prior ACDF. Stable visualized osseous structures. Stable cholecystectomy clips. Negative visible bowel gas pattern. IMPRESSION: No acute cardiopulmonary abnormality. Electronically Signed   By: Odessa Fleming M.D.   On: 10/13/2018 11:34    Procedures Procedures (including critical care time)  Medications Ordered in UC Medications  ipratropium-albuterol (DUONEB) 0.5-2.5 (3) MG/3ML nebulizer solution 3 mL (3 mLs Nebulization Given 10/13/18 1132)  dexamethasone (DECADRON) injection 10 mg (10 mg Intramuscular Given 10/13/18 1131)    Initial Impression /  Assessment and Plan / UC Course  I have reviewed the triage vital signs and the nursing notes.  Pertinent labs & imaging results that were available during my care of the patient were reviewed by me and considered in my medical decision making (see chart for details).     Discussed CXR with pt, reassured no acute findings. Vitals: WNL with O2 Sat 98% on RA even prior to breathing treatment. Medical records reviewed including cardiology workup for atypical chest pain last month. Pt denies chest pain today. Lung sounds improved after treatment, pt able to take a deeper breath. Pt reports feeling moderate improvement. Albuterol inhaler prescribed. Pt declined work note.   Final Clinical Impressions(s) / UC Diagnoses   Final diagnoses:  Mild intermittent asthma with exacerbation     Discharge Instructions      Please follow up with your primary care provider next week if not improving.  Call 911 or go to the hospital if symptoms worsening.     ED Prescriptions    Medication Sig Dispense Auth. Provider   albuterol (PROVENTIL HFA;VENTOLIN HFA) 108 (90 Base) MCG/ACT inhaler Inhale 1-2 puffs into the lungs every 6 (six) hours as needed for wheezing or shortness of breath. 1 Inhaler Lurene Shadow, PA-C     Controlled Substance Prescriptions [place] Controlled Substance Registry consulted? Not Applicable   Lurene Shadow, PA-C 10/13/18 1200

## 2018-10-13 NOTE — Discharge Instructions (Signed)
°  Please follow up with your primary care provider next week if not improving.  Call 911 or go to the hospital if symptoms worsening.

## 2018-10-13 NOTE — ED Triage Notes (Signed)
Pt c/o nonproductive cough, scratchy throat, and "breathing feels off" x 1 day. Denies fever. She took Mucinex and cough syrup yesterday.

## 2018-10-15 ENCOUNTER — Telehealth: Payer: Self-pay | Admitting: Emergency Medicine

## 2018-10-15 MED ORDER — AZITHROMYCIN 250 MG PO TABS: 250.0000 mg | ORAL_TABLET | Freq: Every day | ORAL | 0 refills | Status: AC

## 2018-10-15 NOTE — Telephone Encounter (Signed)
Patient informed that Tabitha Mcdonald send in an antb for her to start on.  If no better after starting antb, will follow up with PCP.

## 2018-10-15 NOTE — Telephone Encounter (Signed)
Left message advising if doing well to disregard the call, any questions or concerns, to contact the office. 

## 2018-10-15 NOTE — Telephone Encounter (Signed)
Patient called in stating that she is no better, feels like she has a sinus infection.  Patient is coughing up mucous, body aches and loss of voice.  Does she need to be seen here again or can something be called into pharmacy?  Please advise.

## 2018-10-15 NOTE — Addendum Note (Signed)
Addended by: Lurene Shadow on: 10/15/2018 04:08 PM   Modules accepted: Orders

## 2018-10-15 NOTE — Telephone Encounter (Signed)
Azithromycin sent to [facility] on [address].  Please encourage pt to f/u with PCP later this week if still not improving with antibiotic.

## 2018-11-28 IMAGING — MG DIGITAL SCREENING BILATERAL MAMMOGRAM WITH TOMO AND CAD
6 of 10 series · 6 of 30 positions shown · non-contrast
Comparison: Previous exam(s).

CLINICAL DATA: Screening.

EXAM:
DIGITAL SCREENING BILATERAL MAMMOGRAM WITH TOMO AND CAD

[R MLO synth-2D]
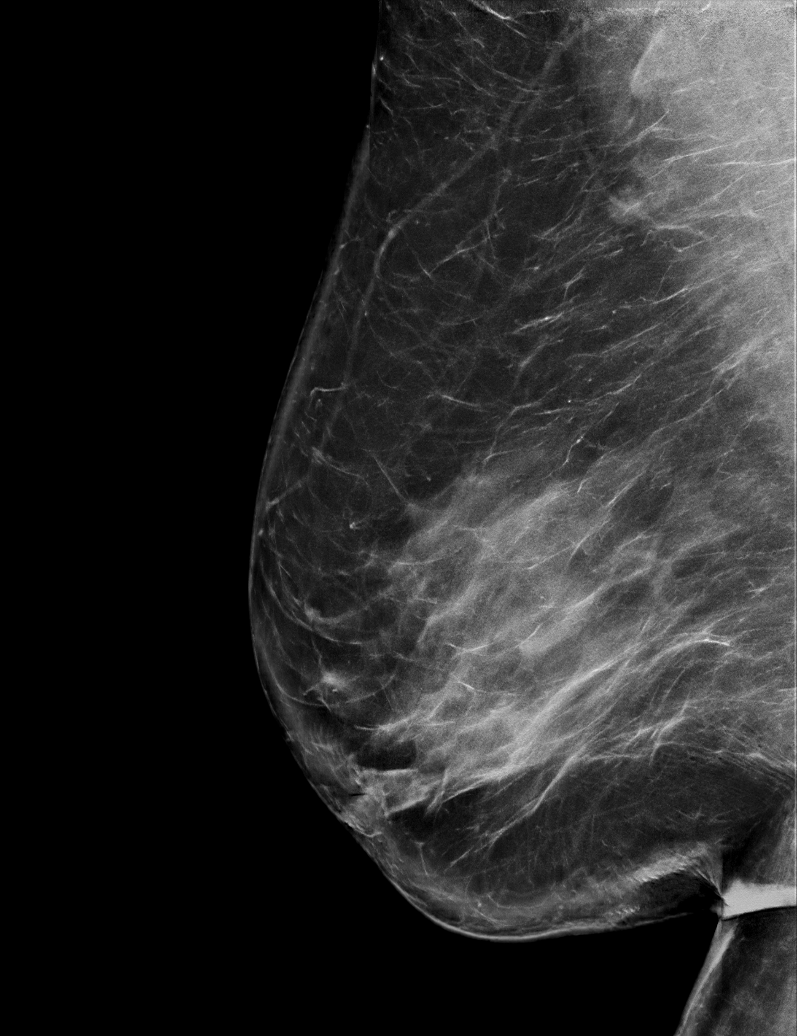

[L CC synth-2D]
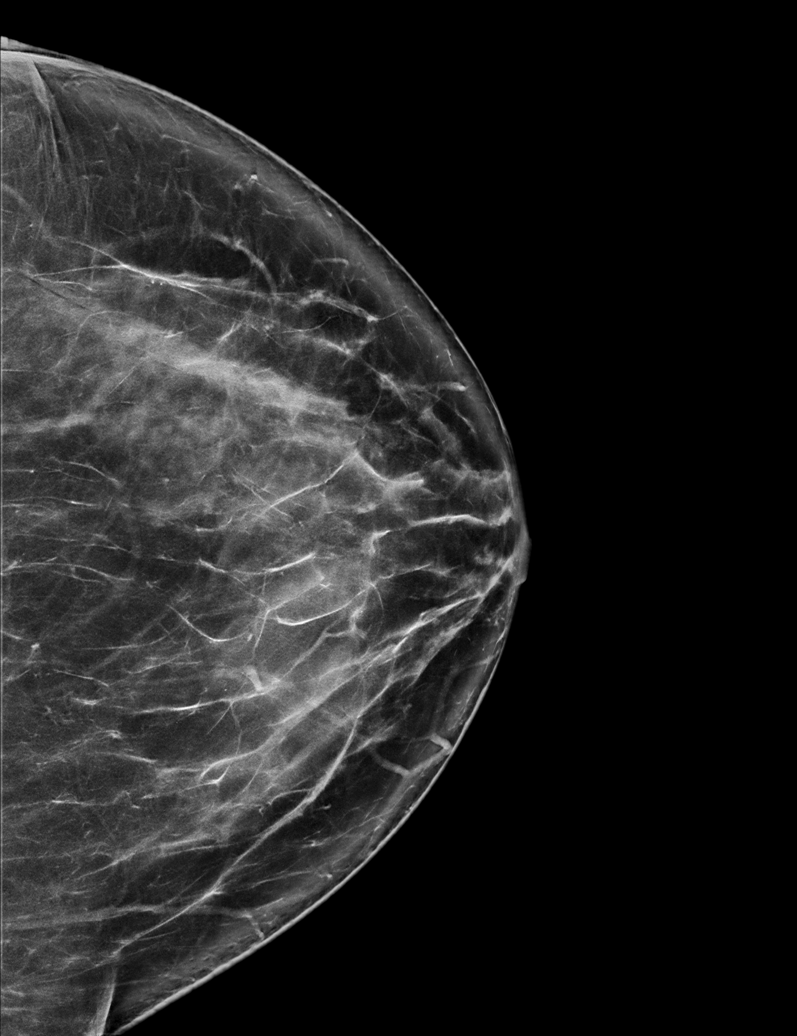

[L MLO synth-2D]
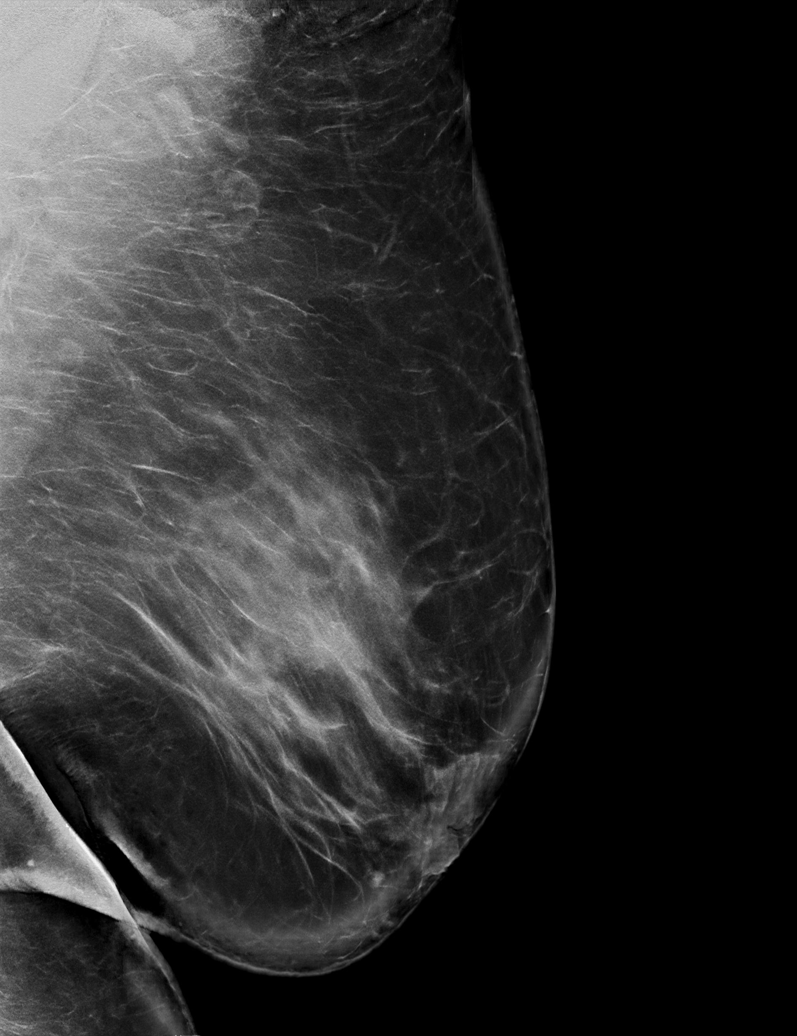

[R CC synth-2D]
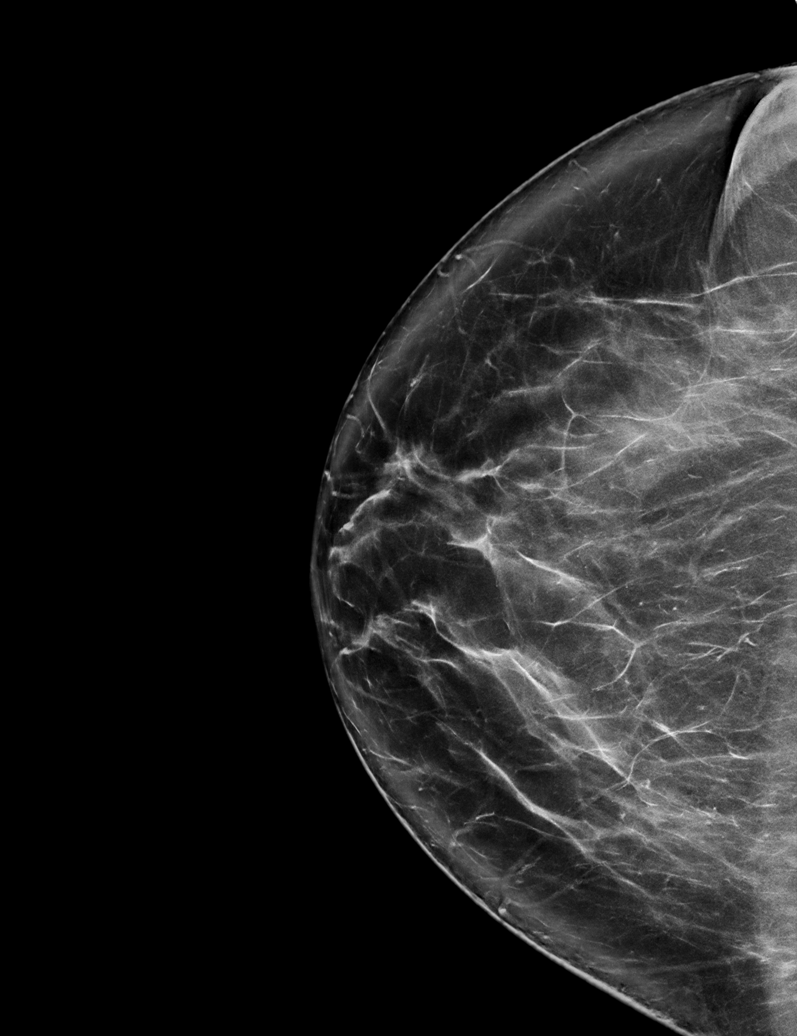

[L XCCL synth-2D]
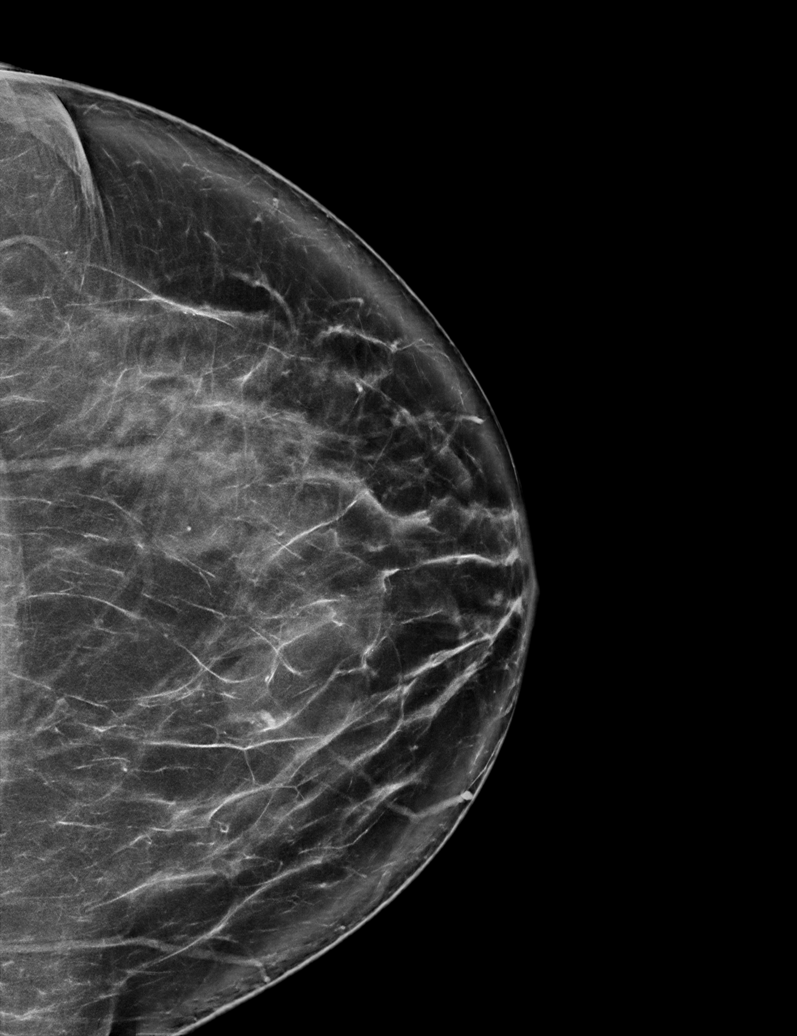

[R CC tomo · tomo slice 44/87.0]
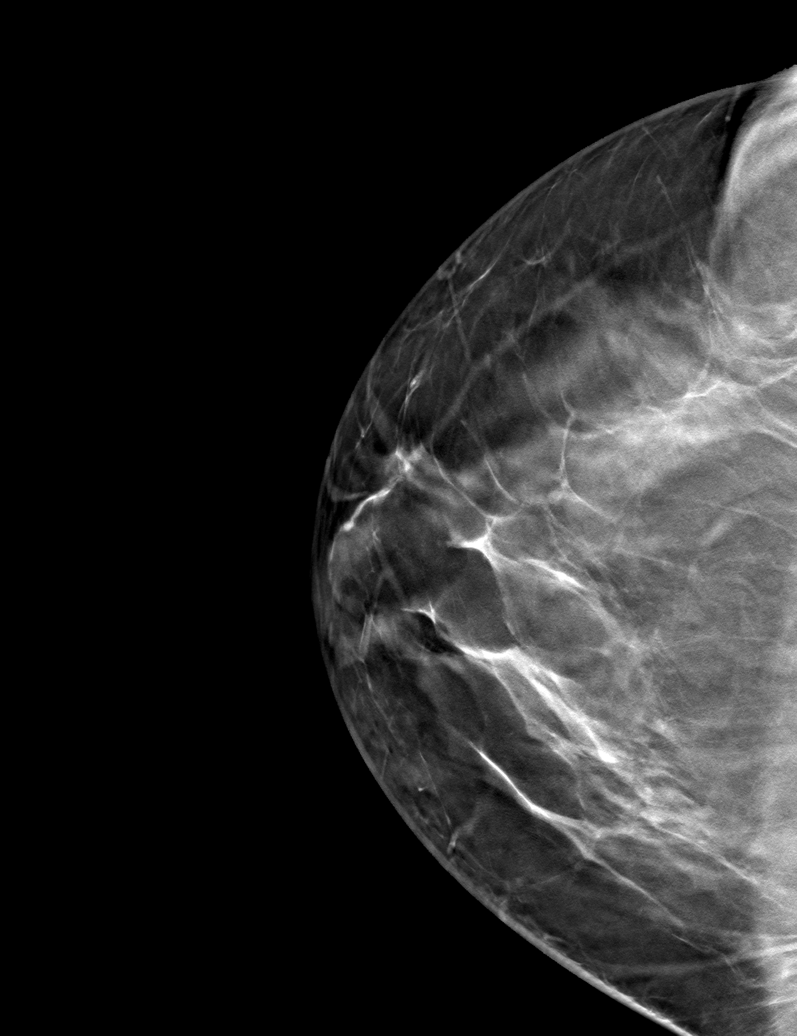

[6 of 30 positions shown; findings below may reference images not displayed]

ACR Breast Density Category c: The breast tissue is heterogeneously
dense, which may obscure small masses.
FINDINGS: There are no findings suspicious for malignancy. Images were
processed with CAD.
IMPRESSION: No mammographic evidence of malignancy. A result letter of this
screening mammogram will be mailed directly to the patient.

RECOMMENDATION:
Screening mammogram in one year. (Code:[id number]-U-LHB)

BI-RADS CATEGORY  1: Negative.

## 2018-11-29 ENCOUNTER — Other Ambulatory Visit: Payer: Self-pay | Admitting: Osteopathic Medicine

## 2018-12-04 ENCOUNTER — Other Ambulatory Visit: Payer: Self-pay | Admitting: Osteopathic Medicine

## 2018-12-15 ENCOUNTER — Encounter (INDEPENDENT_AMBULATORY_CARE_PROVIDER_SITE_OTHER): Payer: BLUE CROSS/BLUE SHIELD | Admitting: Ophthalmology

## 2019-01-03 ENCOUNTER — Other Ambulatory Visit: Payer: Self-pay | Admitting: Osteopathic Medicine

## 2019-01-23 ENCOUNTER — Other Ambulatory Visit: Payer: Self-pay | Admitting: Osteopathic Medicine

## 2019-02-20 ENCOUNTER — Other Ambulatory Visit: Payer: Self-pay | Admitting: Osteopathic Medicine

## 2019-02-20 NOTE — Telephone Encounter (Signed)
Routing to Dr Alexander's rx refill pool 

## 2019-03-18 ENCOUNTER — Other Ambulatory Visit: Payer: Self-pay | Admitting: Osteopathic Medicine

## 2019-05-23 ENCOUNTER — Other Ambulatory Visit: Payer: Self-pay | Admitting: Osteopathic Medicine

## 2019-06-19 ENCOUNTER — Other Ambulatory Visit: Payer: Self-pay | Admitting: Osteopathic Medicine

## 2019-06-19 NOTE — Telephone Encounter (Signed)
Please advise 

## 2019-06-24 ENCOUNTER — Other Ambulatory Visit: Payer: Self-pay | Admitting: Osteopathic Medicine

## 2019-07-02 ENCOUNTER — Other Ambulatory Visit: Payer: Self-pay | Admitting: Osteopathic Medicine

## 2019-07-02 NOTE — Telephone Encounter (Signed)
Please review for refill- patient at [facility] 

## 2019-08-04 ENCOUNTER — Other Ambulatory Visit: Payer: Self-pay | Admitting: Osteopathic Medicine

## 2019-08-19 ENCOUNTER — Other Ambulatory Visit: Payer: Self-pay | Admitting: Osteopathic Medicine

## 2019-09-23 ENCOUNTER — Other Ambulatory Visit: Payer: Self-pay | Admitting: Osteopathic Medicine

## 2019-10-11 ENCOUNTER — Other Ambulatory Visit: Payer: Self-pay | Admitting: Physician Assistant

## 2019-10-11 DIAGNOSIS — M5412 Radiculopathy, cervical region: Secondary | ICD-10-CM

## 2019-12-25 ENCOUNTER — Other Ambulatory Visit: Payer: Self-pay | Admitting: Sports Medicine

## 2019-12-25 ENCOUNTER — Ambulatory Visit: Payer: BLUE CROSS/BLUE SHIELD | Admitting: Sports Medicine

## 2019-12-25 ENCOUNTER — Ambulatory Visit (INDEPENDENT_AMBULATORY_CARE_PROVIDER_SITE_OTHER): Payer: Medicare Other

## 2019-12-25 ENCOUNTER — Encounter: Payer: Self-pay | Admitting: Sports Medicine

## 2019-12-25 ENCOUNTER — Other Ambulatory Visit: Payer: Self-pay

## 2019-12-25 DIAGNOSIS — B359 Dermatophytosis, unspecified: Secondary | ICD-10-CM

## 2019-12-25 DIAGNOSIS — M79671 Pain in right foot: Secondary | ICD-10-CM

## 2019-12-25 DIAGNOSIS — M2041 Other hammer toe(s) (acquired), right foot: Secondary | ICD-10-CM

## 2019-12-25 DIAGNOSIS — L84 Corns and callosities: Secondary | ICD-10-CM | POA: Diagnosis not present

## 2019-12-25 NOTE — Progress Notes (Signed)
Subjective: Tabitha Mcdonald is a 64 y.o. female patient who presents to office for evaluation of Right foot pain. Patient complains of progressive pain especially over the last 2 weeks that started after pedicure with swelling and itching to the right 4th toe. Patient has tried soaking and hydrocortisone cream  with no relief in symptoms. Patient denies any other pedal complaints.    Review of Systems  All other systems reviewed and are negative.   Patient Active Problem List   Diagnosis Date Noted  . Recurrent genital HSV (herpes simplex virus) infection 06/02/2018  . Essential hypertension 06/02/2018  . 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 Chronic insomnia 05/18/2016  . Cervical disc disorder with radiculopathy 01/21/2015    Current Outpatient Medications on File Prior to Visit  Medication Sig Dispense Refill  . albuterol (PROVENTIL HFA;VENTOLIN HFA) 108 (90 Base) MCG/ACT inhaler Inhale 1-2 puffs into the lungs every 6 (six) hours as needed for wheezing or shortness of breath. 1 Inhaler 0  . azithromycin (ZITHROMAX) 250 MG tablet Take 1 tablet (250 mg total) by mouth daily. Take first 2 tablets together, then 1 every day until finished. 6 tablet 0  . Cholecalciferol (VITAMIN D3 PO) Take 1 capsule by mouth daily.    Marland Kitchen estradiol (VIVELLE-DOT) 0.025 MG/24HR PLACE 1 PATCH ONTO THE SKIN 2 (TWO) TIMES A WEEK. 12 patch 0  . fexofenadine (ALLEGRA) 180 MG tablet Take 1 tablet (180 mg total) by mouth daily as needed. 90 tablet 3  . hydrochlorothiazide (HYDRODIURIL) 12.5 MG tablet TAKE 1 TABLET BY MOUTH EVERY DAY 15 tablet 0  . HYDROcodone-acetaminophen (NORCO) 10-325 MG tablet Take 1-2 tablets by mouth every 4 (four) hours as needed for severe pain ((score 7 to 10)). 50 tablet 0  . methocarbamol (ROBAXIN) 500 MG tablet Take 1 tablet (500 mg total) by mouth every 6 (six) hours as needed for muscle spasms. 50 tablet 0  . omeprazole (PRILOSEC) 20 MG capsule Take 1 capsule  (20 mg total) by mouth daily. 30 capsule 0  . pantoprazole (PROTONIX) 40 MG tablet Take 1 tablet (40 mg total) by mouth daily. 90 tablet 3  . valACYclovir (VALTREX) 1000 MG tablet Take 1 tablet (1,000 mg total) by mouth daily. 90 tablet 3  . zolpidem (AMBIEN) 10 MG tablet   4   No current facility-administered medications on file prior to visit.    Allergies  Allergen Reactions  . Amoxicillin Nausea And Vomiting and Other (See Comments)    Causes upset stomach, worsening Acid reflux Yeast infection  . Ondansetron Hcl Other (See Comments)    Gives patient migraines   . Ibuprofen Nausea And Vomiting and Other (See Comments)  . Oxycodone Nausea And Vomiting and Other (See Comments)    Objective:  General: Alert and oriented x3 in no acute distress  Dermatology: Small hyperkeratotic lesion overlying 5 PIPJ dorsally on right 5th toe. No open lesions bilateral lower extremities, no webspace macerations, no ecchymosis bilateral, subjective itchiness and burning to right 4 toe, all nails x 10 are well manicured.  Vascular: Dorsalis Pedis and Posterior Tibial pedal pulses 2/4, Capillary Fill Time 3 seconds,(+) pedal hair growth bilateral, no edema bilateral lower extremities, Temperature gradient within normal limits.  Neurology: Michaell Cowing sensation intact via light touch bilateral.  Musculoskeletal: Semi-flexible hammertoes 2-5 with Mild tenderness with palpation at PIPJ Right on 4th toe. Ankle, Subtalar, Midtarsal, and MTPJ joint range of motion is within normal limits, there is no 1st ray hypermobility noted bilateral,  No bunion deformity noted bilateral. No pain with calf compression bilateral.  Strength within normal limits in all groups bilateral.   Gait: Unassisted, Non-antalgic.  Xrays  Right Foot    Impression: Mild digital contracture no other acute findings       Assessment and Plan: Problem List Items Addressed This Visit    None    Visit Diagnoses    Hammertoe of right foot     -  Primary   Relevant Orders   DG Foot Complete Right   Corn of toe       Right foot pain       Tinea       Early with itchy sensation to toe       -Complete examination performed -Xrays reviewed -Discussed treatment options for hammertoe and early tinea -Rx Toe cap for right 4th toe to use in shoes -Recommend OTC Lamisil or Tinactin Spray at bedtime -Advised patient that if fails to improve after 1 month to return to office may require hammertoe surgery  -Patient to return to office as needed or sooner if condition worsens.  Landis Martins, DPM

## 2019-12-25 NOTE — Patient Instructions (Addendum)
Recommend OTC lamisil or Tinactin spray at bedtime to toes for itching and burning

## 2020-03-24 ENCOUNTER — Ambulatory Visit: Payer: Self-pay

## 2021-12-03 ENCOUNTER — Emergency Department (HOSPITAL_BASED_OUTPATIENT_CLINIC_OR_DEPARTMENT_OTHER): Payer: Medicare (Managed Care) | Admitting: Radiology

## 2021-12-03 ENCOUNTER — Encounter (HOSPITAL_BASED_OUTPATIENT_CLINIC_OR_DEPARTMENT_OTHER): Payer: Self-pay

## 2021-12-03 ENCOUNTER — Emergency Department (HOSPITAL_BASED_OUTPATIENT_CLINIC_OR_DEPARTMENT_OTHER): Payer: Medicare (Managed Care)

## 2021-12-03 ENCOUNTER — Other Ambulatory Visit: Payer: Self-pay

## 2021-12-03 ENCOUNTER — Emergency Department (HOSPITAL_BASED_OUTPATIENT_CLINIC_OR_DEPARTMENT_OTHER)
Admission: EM | Admit: 2021-12-03 | Discharge: 2021-12-03 | Disposition: A | Discharge status: Home / Self Care | Payer: Medicare (Managed Care) | Attending: Emergency Medicine | Admitting: Emergency Medicine

## 2021-12-03 DIAGNOSIS — J45909 Unspecified asthma, uncomplicated: Secondary | ICD-10-CM | POA: Diagnosis not present

## 2021-12-03 DIAGNOSIS — M545 Low back pain, unspecified: Secondary | ICD-10-CM | POA: Diagnosis not present

## 2021-12-03 DIAGNOSIS — M542 Cervicalgia: Secondary | ICD-10-CM | POA: Diagnosis present

## 2021-12-03 DIAGNOSIS — I10 Essential (primary) hypertension: Secondary | ICD-10-CM | POA: Diagnosis not present

## 2021-12-03 DIAGNOSIS — Y92410 Unspecified street and highway as the place of occurrence of the external cause: Secondary | ICD-10-CM | POA: Diagnosis not present

## 2021-12-03 DIAGNOSIS — S161XXA Strain of muscle, fascia and tendon at neck level, initial encounter: Secondary | ICD-10-CM

## 2021-12-03 DIAGNOSIS — S39012A Strain of muscle, fascia and tendon of lower back, initial encounter: Secondary | ICD-10-CM

## 2021-12-03 MED ORDER — ACETAMINOPHEN 500 MG PO TABS
1000.0000 mg | ORAL_TABLET | Freq: Once | ORAL | Status: AC
  Administered 2021-12-03: 22:00:00 1000 mg via ORAL
  Filled 2021-12-03: qty 2

## 2021-12-03 MED ORDER — METHOCARBAMOL 750 MG PO TABS: 750.0000 mg | ORAL_TABLET | Freq: Three times a day (TID) | ORAL | 0 refills | Status: AC | PRN

## 2021-12-03 NOTE — Discharge Instructions (Addendum)
It was our pleasure to provide your ER care today - we hope that you feel better.  Take acetaminophen as need for pain. You may also take robaxin as need for muscle pain/spasm - no driving when taking.   Follow up with primary care doctor in 1-2 weeks if symptoms fail to improve/resolve. Also, your blood pressure is high tonight - continue your meds, limit salt intake, and follow up with primary care doctor in 1-2 weeks.   Return to ER if worse, new symptoms, new/severe or intractable pain, numbness/weakness, or other concern.
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
## 2021-12-03 NOTE — ED Notes (Signed)
Pt has gone to CT

## 2022-08-22 ENCOUNTER — Other Ambulatory Visit: Payer: Self-pay

## 2022-08-22 ENCOUNTER — Emergency Department (HOSPITAL_BASED_OUTPATIENT_CLINIC_OR_DEPARTMENT_OTHER): Payer: Medicare (Managed Care)

## 2022-08-22 ENCOUNTER — Encounter (HOSPITAL_BASED_OUTPATIENT_CLINIC_OR_DEPARTMENT_OTHER): Payer: Self-pay | Admitting: Emergency Medicine

## 2022-08-22 ENCOUNTER — Emergency Department (HOSPITAL_BASED_OUTPATIENT_CLINIC_OR_DEPARTMENT_OTHER)
Admission: EM | Admit: 2022-08-22 | Discharge: 2022-08-22 | Disposition: A | Discharge status: Home / Self Care | Payer: Medicare (Managed Care) | Attending: Emergency Medicine | Admitting: Emergency Medicine

## 2022-08-22 DIAGNOSIS — I10 Essential (primary) hypertension: Secondary | ICD-10-CM

## 2022-08-22 DIAGNOSIS — R600 Localized edema: Secondary | ICD-10-CM | POA: Diagnosis not present

## 2022-08-22 DIAGNOSIS — Z79899 Other long term (current) drug therapy: Secondary | ICD-10-CM | POA: Insufficient documentation

## 2022-08-22 LAB — CBC WITH DIFFERENTIAL/PLATELET
Abs Immature Granulocytes: 0.01 10*3/uL (ref 0.00–0.07)
Basophils Absolute: 0 10*3/uL (ref 0.0–0.1)
Basophils Relative: 0 %
Eosinophils Absolute: 0.1 10*3/uL (ref 0.0–0.5)
Eosinophils Relative: 2 %
HCT: 38.4 % (ref 36.0–46.0)
Hemoglobin: 12.9 g/dL (ref 12.0–15.0)
Immature Granulocytes: 0 %
Lymphocytes Relative: 46 %
Lymphs Abs: 2.3 10*3/uL (ref 0.7–4.0)
MCH: 29.8 pg (ref 26.0–34.0)
MCHC: 33.6 g/dL (ref 30.0–36.0)
MCV: 88.7 fL (ref 80.0–100.0)
Monocytes Absolute: 0.4 10*3/uL (ref 0.1–1.0)
Monocytes Relative: 7 %
Neutro Abs: 2.3 10*3/uL (ref 1.7–7.7)
Neutrophils Relative %: 45 %
Platelets: 210 10*3/uL (ref 150–400)
RBC: 4.33 MIL/uL (ref 3.87–5.11)
RDW: 13.3 % (ref 11.5–15.5)
WBC: 5.2 10*3/uL (ref 4.0–10.5)
nRBC: 0 % (ref 0.0–0.2)

## 2022-08-22 LAB — BRAIN NATRIURETIC PEPTIDE: B Natriuretic Peptide: 39 pg/mL (ref 0.0–100.0)

## 2022-08-22 LAB — TROPONIN I (HIGH SENSITIVITY): Troponin I (High Sensitivity): 4 ng/L (ref <18)

## 2022-08-22 LAB — BASIC METABOLIC PANEL
Anion gap: 9 (ref 5–15)
BUN: 12 mg/dL (ref 8–23)
CO2: 27 mmol/L (ref 22–32)
Calcium: 10.2 mg/dL (ref 8.9–10.3)
Chloride: 104 mmol/L (ref 98–111)
Creatinine, Ser: 0.99 mg/dL (ref 0.44–1.00)
GFR, Estimated: >60 mL/min (ref >60)
Glucose, Bld: 122 mg/dL — ABNORMAL HIGH (ref 70–99)
Potassium: 3.8 mmol/L (ref 3.5–5.1)
Sodium: 140 mmol/L (ref 135–145)

## 2022-08-22 NOTE — ED Triage Notes (Signed)
Patient had ha, ankle swelling and felt some pounding in chest. Home 180/116, 2xx/1xx on home cuff. Stopped Bp meds 2 week ago.

## 2022-08-22 NOTE — ED Provider Notes (Signed)
[facility] EMERGENCY DEPT  Provider Note  CSN: [id number] Arrival date & time: 08/22/22 0135  History Chief Complaint  Patient presents with   Hypertension    Tabitha Mcdonald is a 66 y.o. female with history of HTN was off her Benicar for several months until a PCP office visit on 8.24 when they advised her to start taking it again. She began feeling lightheaded while taking it so they told her to stop it until a follow up visit scheduled later this month. Yesterday she began to have some headaches, chest discomfort and ankle swelling. She checked her BP at home and it was >200 initially, came down to 180s and then she came to the ED. She reports chest discomfort was ongoing all day yesterday.    Home Medications Prior to Admission medications   Medication Sig Start Date End Date Taking? Authorizing Provider  albuterol (PROVENTIL HFA;VENTOLIN HFA) 108 (90 Base) MCG/ACT inhaler Inhale 1-2 puffs into the lungs every 6 (six) hours as needed for wheezing or shortness of breath. 10/13/18   Lurene Shadow, PA-C  azithromycin (ZITHROMAX) 250 MG tablet Take 1 tablet (250 mg total) by mouth daily. Take first 2 tablets together, then 1 every day until finished. 10/15/18   Lurene Shadow, PA-C  Cholecalciferol (VITAMIN D3 PO) Take 1 capsule by mouth daily.    [provider]  estradiol (VIVELLE-DOT) 0.025 MG/24HR PLACE 1 PATCH ONTO THE SKIN 2 (TWO) TIMES A WEEK. 09/24/19   Sunnie Nielsen, DO  fexofenadine (ALLEGRA) 180 MG tablet Take 1 tablet (180 mg total) by mouth daily as needed. 02/16/18   Sunnie Nielsen, DO  hydrochlorothiazide (HYDRODIURIL) 12.5 MG tablet TAKE 1 TABLET BY MOUTH EVERY DAY 08/06/19   Sunnie Nielsen, DO  HYDROcodone-acetaminophen [facility]) 10-325 MG tablet Take 1-2 tablets by mouth every 4 (four) hours as needed for severe pain ((score 7 to 10)). 11/12/17   Tressie Stalker, MD  methocarbamol (ROBAXIN) 500 MG tablet Take 1 tablet (500 mg total) by mouth  every 6 (six) hours as needed for muscle spasms. 11/12/17   Tressie Stalker, MD  methocarbamol (ROBAXIN) 750 MG tablet Take 1 tablet (750 mg total) by mouth 3 (three) times daily as needed (muscle spasm/pain). 12/03/21   Cathren Laine, MD  omeprazole (PRILOSEC) 20 MG capsule Take 1 capsule (20 mg total) by mouth daily. 09/20/18   Maczis, Elmer Sow, PA-C  pantoprazole (PROTONIX) 40 MG tablet Take 1 tablet (40 mg total) by mouth daily. 02/16/18   Sunnie Nielsen, DO  valACYclovir (VALTREX) 1000 MG tablet Take 1 tablet (1,000 mg total) by mouth daily. 06/02/18   Sunnie Nielsen, DO  zolpidem Remus Loffler) 10 MG tablet  02/12/18   [provider]     Allergies    Amoxicillin, Ondansetron hcl, Ibuprofen, and Oxycodone   Review of Systems   Review of Systems Please see HPI for pertinent positives and negatives  Physical Exam BP (!) 164/94 (BP Location: Right Arm)   Pulse 79   Temp 97.8 F (36.6 C) (Oral)   Resp 18   SpO2 96%   Physical Exam Vitals and nursing note reviewed.  Constitutional:      Appearance: Normal appearance.  HENT:     Head: Normocephalic and atraumatic.     Nose: Nose normal.     Mouth/Throat:     Mouth: Mucous membranes are moist.  Eyes:     Extraocular Movements: Extraocular movements intact.     Conjunctiva/sclera: Conjunctivae normal.  Cardiovascular:  Rate and Rhythm: Normal rate.  Pulmonary:     Effort: Pulmonary effort is normal.     Breath sounds: Normal breath sounds.  Abdominal:     General: Abdomen is flat.     Palpations: Abdomen is soft.     Tenderness: There is no abdominal tenderness.  Musculoskeletal:        General: No swelling. Normal range of motion.     Cervical back: Neck supple.     Right lower leg: Edema (trace) present.     Left lower leg: Edema (trace) present.  Skin:    General: Skin is warm and dry.  Neurological:     General: No focal deficit present.     Mental Status: She is alert.  Psychiatric:        Mood and  Affect: Mood normal.     ED Results / Procedures / Treatments   EKG EKG Interpretation  Date/Time:  Sunday August 22 2022 02:14:29 EDT Ventricular Rate:  73 PR Interval:  158 QRS Duration: 74 QT Interval:  388 QTC Calculation: 427 R Axis:   43 Text Interpretation: Normal sinus rhythm with sinus arrhythmia Cannot rule out Anterior infarct , age undetermined Abnormal ECG When compared with ECG of 20-Sep-2018 10:22, PREVIOUS ECG IS PRESENT Confirmed by Susy Frizzle [phone]) on 08/22/2022 3:50:37 AM  Procedures Procedures  Medications Ordered in the ED Medications - No data to display  Initial Impression and Plan  Patient here with elevated BP and with some symptoms related to that. Exam is benign. BP has improved without intervention. Labs done in triage show normal CBC, BMP, BNP and Trop. I personally viewed the images from radiology studies and agree with radiologist interpretation: CXR is clear. No signs of end organ damage. Patient reassured. Advised to call her PCP to discuss starting back on Benicar at a lower dose vs a different medication for better BP control. RTED for any other concerns.   ED Course       MDM Rules/Calculators/A&P Medical Decision Making Problems Addressed: Essential hypertension: acute illness or injury  Amount and/or Complexity of Data Reviewed Labs: ordered. Decision-making details documented in ED Course. Radiology: ordered and independent interpretation performed. Decision-making details documented in ED Course. ECG/medicine tests: ordered and independent interpretation performed. Decision-making details documented in ED Course.    Final Clinical Impression(s) / ED Diagnoses Final diagnoses:  Essential hypertension    Rx / DC Orders ED Discharge Orders     None        Pollyann Savoy, MD 08/22/22 [phone]

## 2023-07-29 ENCOUNTER — Other Ambulatory Visit (HOSPITAL_COMMUNITY): Payer: Self-pay

## 2023-09-20 ENCOUNTER — Other Ambulatory Visit (HOSPITAL_BASED_OUTPATIENT_CLINIC_OR_DEPARTMENT_OTHER): Payer: Self-pay

## 2023-09-20 DIAGNOSIS — R400 Somnolence: Secondary | ICD-10-CM

## 2023-09-20 DIAGNOSIS — G4719 Other hypersomnia: Secondary | ICD-10-CM

## 2023-10-14 ENCOUNTER — Ambulatory Visit (HOSPITAL_BASED_OUTPATIENT_CLINIC_OR_DEPARTMENT_OTHER)
Payer: Medicare (Managed Care) | Attending: Student in an Organized Health Care Education/Training Program | Admitting: Internal Medicine

## 2023-10-14 VITALS — Ht 64.0 in | Wt 164.0 lb

## 2023-10-14 DIAGNOSIS — R0683 Snoring: Secondary | ICD-10-CM | POA: Insufficient documentation

## 2023-10-14 DIAGNOSIS — R400 Somnolence: Secondary | ICD-10-CM | POA: Insufficient documentation

## 2023-10-14 DIAGNOSIS — G4719 Other hypersomnia: Secondary | ICD-10-CM | POA: Diagnosis not present

## 2023-10-20 ENCOUNTER — Other Ambulatory Visit: Payer: Self-pay

## 2023-10-20 ENCOUNTER — Emergency Department (HOSPITAL_BASED_OUTPATIENT_CLINIC_OR_DEPARTMENT_OTHER): Payer: Medicare (Managed Care) | Admitting: Radiology

## 2023-10-20 ENCOUNTER — Emergency Department (HOSPITAL_BASED_OUTPATIENT_CLINIC_OR_DEPARTMENT_OTHER)
Admission: EM | Admit: 2023-10-20 | Discharge: 2023-10-20 | Disposition: A | Discharge status: Home / Self Care | Payer: Medicare (Managed Care) | Attending: Emergency Medicine | Admitting: Emergency Medicine

## 2023-10-20 ENCOUNTER — Encounter (HOSPITAL_BASED_OUTPATIENT_CLINIC_OR_DEPARTMENT_OTHER): Payer: Self-pay | Admitting: Emergency Medicine

## 2023-10-20 DIAGNOSIS — E119 Type 2 diabetes mellitus without complications: Secondary | ICD-10-CM | POA: Insufficient documentation

## 2023-10-20 DIAGNOSIS — Z79899 Other long term (current) drug therapy: Secondary | ICD-10-CM | POA: Diagnosis not present

## 2023-10-20 DIAGNOSIS — M79602 Pain in left arm: Secondary | ICD-10-CM | POA: Insufficient documentation

## 2023-10-20 DIAGNOSIS — I10 Essential (primary) hypertension: Secondary | ICD-10-CM | POA: Diagnosis not present

## 2023-10-20 DIAGNOSIS — J45909 Unspecified asthma, uncomplicated: Secondary | ICD-10-CM | POA: Insufficient documentation

## 2023-10-20 NOTE — Discharge Instructions (Signed)
As discussed, x-ray negative for any foreign body.  Recommend follow-up with primary care for reassessment.  Please do not hesitate to return if he worrisome signs and symptoms we discussed become apparent.

## 2023-10-20 NOTE — ED Provider Notes (Signed)
[place] EMERGENCY DEPARTMENT AT [facility] Provider Note   CSN: [id number] Arrival date & time: 10/20/23  2135     History  Chief Complaint  Patient presents with   Foreign Body in Skin    Tabitha Mcdonald is a 67 y.o. female.  HPI   67 year old female presents to the ED with concerns for needle from her Dexcom so being in her left arm.  States that she has a history of diabetes and recently began using Dexcom.  States that she placed it but subsequently removed that it feels like the needle is not attached and still in her arm.  Past medical history significant for GERD, asthma, back pain, hypertension  Home Medications Prior to Admission medications   Medication Sig Start Date End Date Taking? Authorizing Provider  albuterol (PROVENTIL HFA;VENTOLIN HFA) 108 (90 Base) MCG/ACT inhaler Inhale 1-2 puffs into the lungs every 6 (six) hours as needed for wheezing or shortness of breath. 10/13/18   Lurene Shadow, PA-C  azithromycin (ZITHROMAX) 250 MG tablet Take 1 tablet (250 mg total) by mouth daily. Take first 2 tablets together, then 1 every day until finished. 10/15/18   Lurene Shadow, PA-C  Cholecalciferol (VITAMIN D3 PO) Take 1 capsule by mouth daily.    [provider]  estradiol (VIVELLE-DOT) 0.025 MG/24HR PLACE 1 PATCH ONTO THE SKIN 2 (TWO) TIMES A WEEK. 09/24/19   Sunnie Nielsen, DO  fexofenadine (ALLEGRA) 180 MG tablet Take 1 tablet (180 mg total) by mouth daily as needed. 02/16/18   Sunnie Nielsen, DO  hydrochlorothiazide (HYDRODIURIL) 12.5 MG tablet TAKE 1 TABLET BY MOUTH EVERY DAY 08/06/19   Sunnie Nielsen, DO  HYDROcodone-acetaminophen [facility]) 10-325 MG tablet Take 1-2 tablets by mouth every 4 (four) hours as needed for severe pain ((score 7 to 10)). 11/12/17   Tressie Stalker, MD  methocarbamol (ROBAXIN) 500 MG tablet Take 1 tablet (500 mg total) by mouth every 6 (six) hours as needed for muscle spasms. 11/12/17   Tressie Stalker, MD   methocarbamol (ROBAXIN) 750 MG tablet Take 1 tablet (750 mg total) by mouth 3 (three) times daily as needed (muscle spasm/pain). 12/03/21   Cathren Laine, MD  omeprazole (PRILOSEC) 20 MG capsule Take 1 capsule (20 mg total) by mouth daily. 09/20/18   Maczis, Elmer Sow, PA-C  pantoprazole (PROTONIX) 40 MG tablet Take 1 tablet (40 mg total) by mouth daily. 02/16/18   Sunnie Nielsen, DO  valACYclovir (VALTREX) 1000 MG tablet Take 1 tablet (1,000 mg total) by mouth daily. 06/02/18   Sunnie Nielsen, DO  zolpidem Remus Loffler) 10 MG tablet  02/12/18   [provider]      Allergies    Amoxicillin, Ondansetron hcl, Ibuprofen, and Oxycodone    Review of Systems   Review of Systems  All other systems reviewed and are negative.   Physical Exam Updated Vital Signs BP (!) 141/90   Pulse 76   Temp 97.9 F (36.6 C)   Resp 16   Ht 5\' 4"  (1.626 m)   Wt 74.4 kg   SpO2 100%   BMI 28.15 kg/m  Physical Exam Vitals and nursing note reviewed.  Constitutional:      General: She is not in acute distress.    Appearance: She is well-developed.  HENT:     Head: Normocephalic and atraumatic.  Eyes:     Conjunctiva/sclera: Conjunctivae normal.  Cardiovascular:     Rate and Rhythm: Normal rate and regular rhythm.  Pulmonary:  Effort: Pulmonary effort is normal. No respiratory distress.     Breath sounds: Normal breath sounds.  Abdominal:     Palpations: Abdomen is soft.     Tenderness: There is no abdominal tenderness.  Musculoskeletal:        General: No swelling.     Cervical back: Neck supple.  Skin:    General: Skin is warm and dry.     Capillary Refill: Capillary refill takes less than 2 seconds.     Comments: Patient with some bruising as well as pinprick scab on left posterior upper arm.  No obvious palpable firm/metallic object.  Neurological:     Mental Status: She is alert.  Psychiatric:        Mood and Affect: Mood normal.     ED Results / Procedures / Treatments    Labs (all labs ordered are listed, but only abnormal results are displayed) Labs Reviewed - No data to display  EKG None  Radiology DG Humerus Left  Result Date: 10/20/2023 CLINICAL DATA:  Possible foreign body. Question retained needle from Dexcom. EXAM: LEFT HUMERUS - 2+ VIEW COMPARISON:  None Available. FINDINGS: There is no evidence of fracture or other focal bone lesions. Shoulder and elbow alignment are maintained. No radiopaque foreign body. There is overlying artifact from clothing. IMPRESSION: Negative radiographs of the left humerus. No radiopaque foreign body. Electronically Signed   By: Narda Rutherford M.D.   On: 10/20/2023 22:43   Sleep Study Documents  Result Date: 10/20/2023 Ordered by an unspecified provider.   Procedures Procedures    Medications Ordered in ED Medications - No data to display  ED Course/ Medical Decision Making/ A&P                                 Medical Decision Making Amount and/or Complexity of Data Reviewed Radiology: ordered.   This patient presents to the ED for concern of foreign body, this involves an extensive number of treatment options, and is a complaint that carries with it a high risk of complications and morbidity.  The differential diagnosis includes bloody    Co morbidities that complicate the patient evaluation  See HPI   Additional history obtained:  Additional history obtained from EMR External records from outside source obtained and reviewed including hospital records   Lab Tests:  N/a   Imaging Studies ordered:  I ordered imaging studies including left humerus x-ray I independently visualized and interpreted imaging which showed no acute abnormality I agree with the radiologist interpretation   Cardiac Monitoring: / EKG:  The patient was maintained on a cardiac monitor.  I personally viewed and interpreted the cardiac monitored which showed an underlying rhythm of: Sinus rhythm   Consultations  Obtained:  N/a   Problem List / ED Course / Critical interventions / Medication management  Left arm discomfort Reevaluation of the patient  showed that the patient stayed the same I have reviewed the patients home medicines and have made adjustments as needed   Social Determinants of Health:  Denies tobacco, illicit drug use   Test / Admission - Considered:  Suspected foreign body left arm Vitals signs significant for hypertension blood pressure 141/90. Otherwise within normal range and stable throughout visit. Imaging studies significant for: Above 67 year old female presents to the emergency department with complaints of possible foreign body in left arm.  Patient removed her Dexcom and felt like a needle did not come  out with head.  On exam, patient without any appreciable foreign body in posterior left upper arm.  X-ray imaging without evidence of radiopaque object.  On exam of the Dexcom, small needlelike object within the body of the device that patient is unable to see due to baseline visual impairment.  Low suspicion for retained foreign body.  Will recommend routine follow-up with primary care.  Treatment plan discussed at length with patient and she acknowledged understanding was agreeable to said plan.  Patient overall well-appearing, afebrile in no acute distress. Worrisome signs and symptoms were discussed with the patient, and the patient acknowledged understanding to return to the ED if noticed. Patient was stable upon discharge.          Final Clinical Impression(s) / ED Diagnoses Final diagnoses:  Left arm pain    Rx / DC Orders ED Discharge Orders     None         Peter Garter, [place] 10/21/23 1112    Rozelle Logan, DO 10/27/23 0730

## 2023-10-20 NOTE — ED Triage Notes (Signed)
Pt reports she removed her Dexcom from her left arm, reports needle was not attached, concerned still in arm

## 2023-10-22 DIAGNOSIS — R400 Somnolence: Secondary | ICD-10-CM | POA: Diagnosis not present

## 2023-10-22 NOTE — Procedures (Signed)
   Patient Name: Tabitha Mcdonald, [place] Date: 10/14/2023 Gender: Female D.O.B: August 16, 1956 Age (years): 46 Referring Provider: Domingo Cocking MD Height (inches): 64 Interpreting Physician: Jetty Duhamel MD, ABSM Weight (lbs): 164 RPSGT: Armen Pickup BMI: 28 MRN: [id number] Neck Size: 13.50  CLINICAL INFORMATION Sleep Study Type: NPSG Indication for sleep study: Fatigue, Insomnia, Snoring Epworth Sleepiness Score: 0  Most recent polysomnogram dated 06/20/2016 revealed an AHI of 2.5/h and RDI of 2.5/h.  SLEEP STUDY TECHNIQUE As per the AASM Manual for the Scoring of Sleep and Associated Events v2.3 (April 2016) with a hypopnea requiring 4% desaturations.  The channels recorded and monitored were frontal, central and occipital EEG, electrooculogram (EOG), submentalis EMG (chin), nasal and oral airflow, thoracic and abdominal wall motion, anterior tibialis EMG, snore microphone, electrocardiogram, and pulse oximetry.  MEDICATIONS Medications self-administered by patient taken the night of the study : AMBIEN, ZOLPIDEM TARTRATE  SLEEP ARCHITECTURE The study was initiated at 10:03:02 PM and ended at 5:02:40 AM.  Sleep onset time was 60.0 minutes and the sleep efficiency was 81.1%. The total sleep time was 340.5 minutes.  Stage REM latency was 87.0 minutes.  The patient spent 1.6% of the night in stage N1 sleep, 68.3% in stage N2 sleep, 0.0% in stage N3 and 30.1% in REM.  Alpha intrusion was absent.  Supine sleep was 99.95%.  RESPIRATORY PARAMETERS The overall apnea/hypopnea index (AHI) was 3.3 per hour. There were 2 total apneas, including 1 obstructive, 1 central and 0 mixed apneas. There were 17 hypopneas and 4 RERAs.  The AHI during Stage REM sleep was 10.5 per hour.  AHI while supine was 3.3 per hour.  The mean oxygen saturation was 91.8%. The minimum SpO2 during sleep was 87.0%. moderate snoring was noted during this study.  CARDIAC DATA The 2 lead EKG demonstrated sinus  rhythm. The mean heart rate was 85.4 beats per minute. Other EKG findings include: None.  LEG MOVEMENT DATA The total PLMS were 0 with a resulting PLMS index of 0.0. Associated arousal with leg movement index was 0.4 .  IMPRESSIONS - No significant obstructive sleep apnea occurred during this study (AHI = 3.3/h). - No significant central sleep apnea occurred during this study (CAI = 0.2/h). - Mild oxygen desaturation was noted during this study (Min O2 = 87.0%, Mean 91.8%). - The patient snored with moderate snoring volume. - No cardiac abnormalities were noted during this study. - Clinically significant periodic limb movements did not occur during sleep. No significant associated arousals.  DIAGNOSIS - Primary Snoring  RECOMMENDATIONS - Manage for snoring and symptoms based on clinical judgment - Sleep hygiene should be reviewed to assess factors that may improve sleep quality. - Weight management and regular exercise should be initiated or continued if appropriate.  [Electronically signed] 10/22/2023 01:11 PM  Jetty Duhamel MD, ABSM Diplomate, American Board of Sleep Medicine NPI: [id number]                          Jetty Duhamel Diplomate, American Board of Sleep Medicine  ELECTRONICALLY SIGNED ON:  10/22/2023, 1:09 PM [facility] PH: [phone]   FX: [phone] ACCREDITED BY THE AMERICAN ACADEMY OF SLEEP MEDICINE
